# Patient Record
Sex: Female | Born: 1990
Health system: Southern US, Community
[De-identification: ages and names within clinical notes are randomized; demographics above are authoritative.]

## PROBLEM LIST (undated history)

## (undated) DIAGNOSIS — A64 Unspecified sexually transmitted disease: Secondary | ICD-10-CM

## (undated) HISTORY — PX: SALPINGOSTOMY: SHX2372

## (undated) HISTORY — PX: TONSILLECTOMY: SUR1361

---

## 2013-04-04 ENCOUNTER — Encounter (HOSPITAL_COMMUNITY): Payer: Self-pay | Admitting: Emergency Medicine

## 2013-04-04 ENCOUNTER — Emergency Department (HOSPITAL_COMMUNITY)
Admission: EM | Admit: 2013-04-04 | Discharge: 2013-04-04 | Disposition: A | Discharge status: Home / Self Care | Payer: 59 | Attending: Emergency Medicine | Admitting: Emergency Medicine

## 2013-04-04 DIAGNOSIS — R197 Diarrhea, unspecified: Secondary | ICD-10-CM | POA: Insufficient documentation

## 2013-04-04 DIAGNOSIS — R1110 Vomiting, unspecified: Secondary | ICD-10-CM

## 2013-04-04 DIAGNOSIS — Z3202 Encounter for pregnancy test, result negative: Secondary | ICD-10-CM | POA: Insufficient documentation

## 2013-04-04 DIAGNOSIS — R109 Unspecified abdominal pain: Secondary | ICD-10-CM | POA: Insufficient documentation

## 2013-04-04 DIAGNOSIS — F17200 Nicotine dependence, unspecified, uncomplicated: Secondary | ICD-10-CM | POA: Insufficient documentation

## 2013-04-04 DIAGNOSIS — R51 Headache: Secondary | ICD-10-CM | POA: Insufficient documentation

## 2013-04-04 DIAGNOSIS — M549 Dorsalgia, unspecified: Secondary | ICD-10-CM

## 2013-04-04 DIAGNOSIS — R112 Nausea with vomiting, unspecified: Secondary | ICD-10-CM | POA: Insufficient documentation

## 2013-04-04 LAB — CBC WITH DIFFERENTIAL/PLATELET
BASOS PCT: 0 % (ref 0–1)
Basophils Absolute: 0 10*3/uL (ref 0.0–0.1)
Eosinophils Absolute: 0.1 10*3/uL (ref 0.0–0.7)
Eosinophils Relative: 1 % (ref 0–5)
HCT: 34.6 % — ABNORMAL LOW (ref 36.0–46.0)
Hemoglobin: 11.6 g/dL — ABNORMAL LOW (ref 12.0–15.0)
Lymphocytes Relative: 32 % (ref 12–46)
Lymphs Abs: 2.3 10*3/uL (ref 0.7–4.0)
MCH: 27.4 pg (ref 26.0–34.0)
MCHC: 33.5 g/dL (ref 30.0–36.0)
MCV: 81.6 fL (ref 78.0–100.0)
Monocytes Absolute: 0.5 10*3/uL (ref 0.1–1.0)
Monocytes Relative: 7 % (ref 3–12)
NEUTROS PCT: 60 % (ref 43–77)
Neutro Abs: 4.4 10*3/uL (ref 1.7–7.7)
Platelets: 277 10*3/uL (ref 150–400)
RBC: 4.24 MIL/uL (ref 3.87–5.11)
RDW: 14.1 % (ref 11.5–15.5)
WBC: 7.3 10*3/uL (ref 4.0–10.5)

## 2013-04-04 LAB — COMPREHENSIVE METABOLIC PANEL
ALBUMIN: 3.8 g/dL (ref 3.5–5.2)
ALK PHOS: 51 U/L (ref 39–117)
ALT: 26 U/L (ref 0–35)
AST: 24 U/L (ref 0–37)
BUN: 10 mg/dL (ref 6–23)
CO2: 25 mEq/L (ref 19–32)
Calcium: 9.3 mg/dL (ref 8.4–10.5)
Chloride: 100 mEq/L (ref 96–112)
Creatinine, Ser: 0.72 mg/dL (ref 0.50–1.10)
GFR calc Af Amer: >90 mL/min (ref >90)
GFR calc non Af Amer: >90 mL/min (ref >90)
Glucose, Bld: 83 mg/dL (ref 70–99)
POTASSIUM: 3.7 meq/L (ref 3.7–5.3)
SODIUM: 137 meq/L (ref 137–147)
TOTAL PROTEIN: 7.8 g/dL (ref 6.0–8.3)
Total Bilirubin: <0.2 mg/dL — ABNORMAL LOW (ref 0.3–1.2)

## 2013-04-04 LAB — URINALYSIS, ROUTINE W REFLEX MICROSCOPIC
Bilirubin Urine: NEGATIVE
Glucose, UA: NEGATIVE mg/dL
Hgb urine dipstick: NEGATIVE
Ketones, ur: NEGATIVE mg/dL
LEUKOCYTES UA: NEGATIVE
NITRITE: NEGATIVE
PH: 6.5 (ref 5.0–8.0)
Protein, ur: NEGATIVE mg/dL
SPECIFIC GRAVITY, URINE: 1.005 (ref 1.005–1.030)
Urobilinogen, UA: 1 mg/dL (ref 0.0–1.0)

## 2013-04-04 LAB — POCT PREGNANCY, URINE: Preg Test, Ur: NEGATIVE

## 2013-04-04 LAB — HCG, SERUM, QUALITATIVE: PREG SERUM: NEGATIVE

## 2013-04-04 MED ORDER — TRAMADOL HCL 50 MG PO TABS: 50.0000 mg | ORAL_TABLET | Freq: Four times a day (QID) | ORAL | Status: DC | PRN

## 2013-04-04 MED ORDER — ONDANSETRON 4 MG PO TBDP
4.0000 mg | ORAL_TABLET | Freq: Three times a day (TID) | ORAL | Status: DC | PRN
Start: 2013-04-04 — End: 2016-12-05

## 2013-04-04 NOTE — ED Provider Notes (Signed)
CSN: [id number]     Arrival date & time 04/04/13  1959 History   First MD Initiated Contact with Patient 04/04/13 2001     Chief Complaint  Patient presents with  . Abdominal Pain  . Hematemesis   (Consider location/radiation/quality/duration/timing/severity/associated sxs/prior Treatment) HPI Comments: Patient presents with complaint of abdominal pain, back pain, and vomiting for the past 1 to 2 months. Patient was concerned today because she noted a small amount of blood in her vomit. She vomited 4 times today. Patient states that she has been vomiting nearly every day. Patient states that the symptoms are similar to previous pregnancy. She does note, that she has had negative urine pregnancy test in the past when her blood has shown positive pregnancy. Patient denies fever, chest pain, shortness of breath, urinary symptoms, current vaginal bleeding. Patient's last menstrual period ended early December 2014. Patient notes previous surgery for, 'tube repair' after the birth of her son. No treatments prior to arrival. The onset of this condition was acute. The course is constant. Aggravating factors: none. Alleviating factors: none.    Patient is a 23 y.o. female presenting with abdominal pain. The history is provided by the patient.  Abdominal Pain Associated symptoms: diarrhea, nausea and vomiting   Associated symptoms: no chest pain, no cough, no dysuria, no fever, no sore throat and no vaginal bleeding     History reviewed. No pertinent past medical history. History reviewed. No pertinent past surgical history. History reviewed. No pertinent family history. History  Substance Use Topics  . Smoking status: Current Some Day Smoker  . Smokeless tobacco: Not on file  . Alcohol Use: Yes   OB History   Grav Para Term Preterm Abortions TAB SAB Ect Mult Living                 Review of Systems  Constitutional: Negative for fever.  HENT: Negative for rhinorrhea and sore throat.   Eyes:  Negative for redness.  Respiratory: Negative for cough.   Cardiovascular: Negative for chest pain.  Gastrointestinal: Positive for nausea, vomiting, abdominal pain and diarrhea.  Genitourinary: Negative for dysuria and vaginal bleeding.  Musculoskeletal: Negative for myalgias.  Skin: Negative for rash.  Neurological: Positive for headaches.    Allergies  Review of patient's allergies indicates no known allergies.  Home Medications   Current Outpatient Rx  Name  Route  Sig  Dispense  Refill  . ondansetron (ZOFRAN ODT) 4 MG disintegrating tablet   Oral   Take 1 tablet (4 mg total) by mouth every 8 (eight) hours as needed for nausea or vomiting.   10 tablet   0   . traMADol (ULTRAM) 50 MG tablet   Oral   Take 1 tablet (50 mg total) by mouth every 6 (six) hours as needed.   10 tablet   0    BP 124/73  Pulse 81  Temp(Src) 98.3 F (36.8 C) (Oral)  Resp 14  SpO2 100%  LMP 02/25/2013  Physical Exam  Nursing note and vitals reviewed. Constitutional: She appears well-developed and well-nourished.  HENT:  Head: Normocephalic and atraumatic.  Eyes: Conjunctivae are normal. Right eye exhibits no discharge. Left eye exhibits no discharge.  Neck: Normal range of motion. Neck supple.  Cardiovascular: Normal rate, regular rhythm and normal heart sounds.   Pulmonary/Chest: Effort normal and breath sounds normal.  Abdominal: Soft. Bowel sounds are normal. There is no tenderness. There is no rebound and no guarding.  Neurological: She is alert.  Skin: Skin  is warm and dry.  Psychiatric: She has a normal mood and affect.    ED Course  Procedures (including critical care time) Labs Review Labs Reviewed  CBC WITH DIFFERENTIAL - Abnormal; Notable for the following:    Hemoglobin 11.6 (*)    HCT 34.6 (*)    All other components within normal limits  COMPREHENSIVE METABOLIC PANEL - Abnormal; Notable for the following:    Total Bilirubin <0.2 (*)    All other components within  normal limits  URINALYSIS, ROUTINE W REFLEX MICROSCOPIC - Abnormal; Notable for the following:    APPearance CLOUDY (*)    All other components within normal limits  HCG, SERUM, QUALITATIVE  POCT PREGNANCY, URINE   Imaging Review No results found.  EKG Interpretation   None      9:14 PM Patient seen and examined. Work-up initiated. Medications ordered.   Vital signs reviewed and are as follows: Filed Vitals:   04/04/13 2005  BP: 124/73  Pulse: 81  Temp: 98.3 F (36.8 C)  Resp: 14   Labs reassuring. Pt informed. She is in room eating and drinking. Abd remains soft and non-tender. Urged PCP f/u for chronic abd pain, back pain, intermittent diarrhea. Will give pain and nausea medication to treat symptomatically.   The patient was urged to return to the Emergency Department immediately with worsening of current symptoms, worsening abdominal pain, persistent vomiting, blood noted in stools, fever, or any other concerns. The patient verbalized understanding.   Patient counseled on use of narcotic pain medications. Counseled not to combine these medications with others containing tylenol. Urged not to drink alcohol, drive, or perform any other activities that requires focus while taking these medications. The patient verbalizes understanding and agrees with the plan.    MDM   1. Vomiting   2. Back pain    Patient with abd pain, back pain, intermittent vomiting/diarrhea for 1-2 months. Her labs tonight are reassuring. No pregnancy. Symptoms controlled in ED. Abd is soft and non-tender. No imaging indicated at this time. No acute or life-threatening conditions suspected and no indications for admission found. Symptoms are chronic in nature. Will have patient f/u as outpatient, return if worsening. Referrals given.     Renne CriglerJoshua Shatonia Hoots, PA-C 04/05/13 0020

## 2013-04-04 NOTE — ED Notes (Signed)
Pt c/o abdominal pain, N/V, lower back pain x 1 month. Pt also c/o blood in her vomit today. Pt sts she hasn't had her period since the beginning of November. Pt sts "I could be pregnant." A&Ox4. NAD noted.

## 2013-04-04 NOTE — Progress Notes (Signed)
   CARE MANAGEMENT ED NOTE 04/04/2013  Patient:  Janet Walton,Janet Walton   Account Number:  [id number]  Date Initiated:  04/04/2013  Documentation initiated by:  Radford PaxFERRERO,Alyene Predmore  Subjective/Objective Assessment:   Patient presents to Ed with abodminal pain, n/v and lower back pain for a month.     Subjective/Objective Assessment Detail:   Patient without pmhx.     Action/Plan:   Pregnancy test completed in the ED   Action/Plan Detail:   Anticipated DC Date:  04/04/2013     Status Recommendation to Physician:   Result of Recommendation:    Other ED Services  Consult Working Plan    DC Planning Services  Other  PCP issues    Choice offered to / List presented to:            Status of service:  Completed, signed off  ED Comments:   ED Comments Detail:  Patient confirms she has [employer] insurance. Patient does not have a pcp.  EDCM instructed patient to go to insurance company website to help her find a physician who is close to her and within network.  Patient verbalized understanding.  EDCM stressed importance of finding a pcp.

## 2013-04-04 NOTE — Discharge Instructions (Signed)
Please read and follow all provided instructions.  Your diagnoses today include:  1. Vomiting   2. Back pain     Tests performed today include:  Blood counts and electrolytes  Blood tests to check liver and kidney function  Urine test to look for infection and pregnancy (in women)  Blood test for pregnancy - negative  Vital signs. See below for your results today.   Medications prescribed:   Tramadol - narcotic-like pain medication  DO NOT drive or perform any activities that require you to be awake and alert because this medicine can make you drowsy.    Zofran (ondansetron) - for nausea and vomiting  Take any prescribed medications only as directed.  Home care instructions:   Follow any educational materials contained in this packet.  Follow-up instructions: Please follow-up with your primary care provider in the next 3 days for further evaluation of your symptoms. If you do not have a primary care doctor -- see below for referral information.   Return instructions:  SEEK IMMEDIATE MEDICAL ATTENTION IF:  The pain does not go away or becomes severe   A temperature above 101F develops   Repeated vomiting occurs (multiple episodes)   The pain becomes localized to portions of the abdomen. The right side could possibly be appendicitis. In an adult, the left lower portion of the abdomen could be colitis or diverticulitis.   Blood is being passed in stools or vomit (bright red or black tarry stools)   You develop chest pain, difficulty breathing, dizziness or fainting, or become confused, poorly responsive, or inconsolable (young children)  If you have any other emergent concerns regarding your health  Additional Information: Abdominal (belly) pain can be caused by many things. Your caregiver performed an examination and possibly ordered blood/urine tests and imaging (CT scan, x-rays, ultrasound). Many cases can be observed and treated at home after initial evaluation  in the emergency department. Even though you are being discharged home, abdominal pain can be unpredictable. Therefore, you need a repeated exam if your pain does not resolve, returns, or worsens. Most patients with abdominal pain don't have to be admitted to the hospital or have surgery, but serious problems like appendicitis and gallbladder attacks can start out as nonspecific pain. Many abdominal conditions cannot be diagnosed in one visit, so follow-up evaluations are very important.  Your vital signs today were: BP 124/73   Pulse 81   Temp(Src) 98.3 F (36.8 C) (Oral)   Resp 14   SpO2 100%   LMP 02/25/2013 If your blood pressure (bp) was elevated above 135/85 this visit, please have this repeated by your doctor within one month. --------------  Emergency Department Resource Guide 1) Find a Doctor and Pay Out of Pocket Although you won't have to find out who is covered by your insurance plan, it is a good idea to ask around and get recommendations. You will then need to call the office and see if the doctor you have chosen will accept you as a new patient and what types of options they offer for patients who are self-pay. Some doctors offer discounts or will set up payment plans for their patients who do not have insurance, but you will need to ask so you aren't surprised when you get to your appointment.  2) Contact Your Local Health Department Not all health departments have doctors that can see patients for sick visits, but many do, so it is worth a call to see if yours does. If you  don't know where your local health department is, you can check in your phone book. The CDC also has a tool to help you locate your state's health department, and many state websites also have listings of all of their local health departments.  3) Find a Walk-in Clinic If your illness is not likely to be very severe or complicated, you may want to try a walk in clinic. These are popping up all over the country in  pharmacies, drugstores, and shopping centers. They're usually staffed by nurse practitioners or physician assistants that have been trained to treat common illnesses and complaints. They're usually fairly quick and inexpensive. However, if you have serious medical issues or chronic medical problems, these are probably not your best option.  No Primary Care Doctor: - Call Health Connect at  [phone] - they can help you locate a primary care doctor that  accepts your insurance, provides certain services, etc. - Physician Referral Service- [phone]  Chronic Pain Problems: Organization         Address  Phone   Notes  [facility]  [phone] Patients need to be referred by their primary care doctor.   Medication Assistance: Organization         Address  Phone   Notes  [facility] Medication [facility] [address] [place], [place] [address] [phone] --Must be a resident of [facility] -- Must have NO insurance coverage whatsoever (no Medicaid/ Medicare, etc.) -- The pt. MUST have a primary care doctor that directs their care regularly and follows them in the community   [facility]  [phone]   [employer]  [phone]    Agencies that provide inexpensive medical care: Organization         Address  Phone   Notes  [facility]  [phone]   [facility]    [phone]   [facility] [address] [place], [place] [address] [phone]   [facility] [address], [place] [phone]   Planned Parenthood    [phone]   [facility]    [phone]   [facility]  [address], [place] Phone:  [phone], Fax:  [phone] Hours of Operation:  9 am - 6 pm, M-F.  Also accepts Medicaid/Medicare and self-pay.  [facility]  [address],  [place] Phone: [phone], Fax: [phone]. Hours of Operation:  8:30 am - 5:30 pm, M-F.  Also accepts Medicaid and self-pay.  [facility] [place] [address], [place] Phone: [phone]   [facility] [address] Natasha Bence [place], [place] [phone], Ext. [phone] Mondays & Thursdays: 7-9 AM.  First 15 patients are seen on a first come, first serve basis.    Medicaid-accepting [facility] Providers:  Organization         Address  Phone   Notes  [facility] [address], [place] [phone] Also accepts self-pay patients.  [facility] [address] Laurell Josephs [place], [place]  [phone]   [facility] [address], [place] [phone]   [facility] [address], [place] [phone]   Renaye Rakers [address], [place]   [phone] Only accepts [place]  Access Medicaid patients after they have their name applied to their card.   Self-Pay (no insurance) in [facility]:  Organization         Address  Phone   Notes  Sickle Cell Patients, [facility] [address], [place] [phone]) [phone]   [facility] [address], [place] [phone]) [phone]   [facility]  [address], [place] [phone]) [phone]   [facility]/Dr. Osei-Bonsu  [address] [place] [address], [place] or [phone], [place] [phone]) [phone] Phone number for both [place] and [place] locations is the same.  [facility] [address], [place] [phone]) [phone]   [facility] [address] [place] [address], [place] or [address] [phone]) [phone]   [facility] [address], [place] [phone](336) [phone], phone; [phone], fax Sees patients 1st and 3rd Saturday of every month.  Must not  qualify for public or private insurance (i.e. Medicaid, Medicare, [place] Health Choice, Veterans' Benefits)  Household income should be no more than 200% of the poverty level The clinic cannot treat you if you are pregnant or think you are pregnant  Sexually transmitted diseases are not treated at the clinic.    Dental Care: Organization         Address  Phone  Notes  [facility] [address], [place] [phone]) [phone] Accepts children up to age 23 who are enrolled in [place] or [place] Health Choice; pregnant women with a Medicaid card; and children who have applied for Medicaid or [place] Health Choice, but were declined, whose parents can pay a reduced fee at time of service.  [facility] [place]  [address], [place] [phone] Accepts children up to age 23 who are enrolled in [place] or [place] Health Choice; pregnant women with a Medicaid card; and children who have applied for Medicaid or [place] Health Choice, but were declined, whose parents can pay a reduced fee at time of service.  [facility]  [address], [place] [phone]) [phone] Patients are seen by appointment only. Walk-ins are not accepted. [facility] will see patients 23 years of age and older. Monday - Tuesday (8am-5pm) Most Wednesdays (8:30-5pm) $30 per visit, cash only  [facility]  [address], [facility] [phone]) [phone] Patients are seen by appointment only. Walk-ins are not accepted. [facility] will see patients 23 years of age and older. One Wednesday Evening (Monthly: Volunteer Based).  $30 per visit, cash only  [facility]  [phone]) [phone] for adults; Children under age 594, call Graduate Pediatric Dentistry at [phone]) [phone]. Children aged 764-14, please call [phone]) [phone] to request a pediatric application.  Dental services are provided  in all areas of dental care including fillings, crowns and bridges, complete and partial dentures, implants, gum treatment, root canals, and extractions. Preventive care is also provided. Treatment is provided to both adults and children. Patients are selected via a lottery and there is often a waiting list.   [facility] [address], [place]  [phone]) [phone] [email or web address]   [facility] [address], [place], [place] [phone], Ext. [phone] Second and Fourth Thursday of each month, opens at 6:30 AM; Clinic ends at 9  AM.  Patients are seen on a first-come first-served basis, and a limited number are seen during each clinic.   [facility]  [address] [place], [place] [phone]   Eligibility Requirements You must have lived in [place], [place], or [place] counties for at least the last three months.   You cannot be eligible for state or federal sponsored [place], including [employer], [place], or [employer].   You generally cannot be eligible for healthcare insurance through your employer.    How to apply: Eligibility screenings are held every Tuesday and Wednesday afternoon from 1:00 pm until 4:00 pm. You do not need an appointment for the interview!  [facility] [address], [place], [place] [phone]   [facility] Health Department  [phone]   [facility] Health Department  [phone]   [facility] Health Department  [phone]    Behavioral Health Resources in the Community: Intensive Outpatient Programs Organization         Address  Phone  Notes  [facility] [address] [place], [place] [phone]   [facility] [address], [place], [place] [phone]   [facility] [address] [place], [place]  [phone]   [facility] [address]  [place], [place]  [phone] or [phone]   Substance Abuse Resources Organization         Address  Phone  Notes  Alcohol [facility] Drug Services  [phone]   [facility]  [phone]   [facility] [place]  [phone]   Floydene Flock  [phone]   Residential & Outpatient Substance Abuse Program  [phone]   Psychological Services Organization         Address  Phone  Notes  [facility]  [phone]   [facility]  [phone]   [facility] [address], [place] [phone] or [phone]    Mobile Crisis Teams Organization         Address  Phone  Notes  [facility], Mobile Crisis Care Unit  [phone]   [facility]  [address] [place], [place] [phone]   Doristine Locks [address] [place] [phone]    Self-Help/Support Groups Organization         Address  Phone             Notes  Mental Health Assoc. of  - variety of support groups  336- I7437963 Call for more information  Narcotics Anonymous (NA), [facility] [address], [employer] [place]  2 meetings at this location   Statistician         Address  Phone  Notes  [facility] [address],    [place]  [phone]   [facility]  [address] [place] [address], [place], [place] [phone]   [facility] [address] [place], [place] [phone] Admissions: 8am-3pm M-F  [facility] [address],    [place], [place] [phone]   [facility] [address] Starling [place], [place] [phone]   [facility] [address],  [place], [place] [phone]   [facility] - Intensive Outpatient [address]ure[address], [place], [place] [phone]   [facility] ([facility]iction [facility] [address] [place]  [place], [place] [phone] or  [phone]   [facility] (  RTS) [address], [place], [place] [phone] Accepts Medicaid  [facility] [address]  [place] [phone] Substance Abuse/Addiction Treatment   [facility] Organization         Address  Phone  Notes  [facility]  [phone]   Angie Fava, PhD [address] Ervin Knack [place], [place]   [phone] or [phone]   [facility] Behavioral   [address] [place], [place] [phone]   [facility] [address], [place], [place] [phone] Insurance/Medicaid/sponsorship through [facility] and Families [address]                                    [place], [place] [phone] Therapy/tele-psych/case  [facility] [address], [place] [phone]    Dr. Lolly Mustache  [phone]   [facility]  [employer] [facility] Dept. 1) [address], [place] 2) [address], [place] 3)  [address], [place] [phone]  [phone]   [facility] Child Abuse Hotline 475-245-6438 or [phone] (After Hours)

## 2013-04-04 NOTE — ED Notes (Signed)
Pt arrived to the ED with a complaint of abdominal pain.  Pt states she has been having abdominal pain and back pain for a month.  Pt states she has had emesis for the same amount of time.  Today pt noted a small amount of blood in her emesis.

## 2013-04-05 NOTE — ED Provider Notes (Signed)
Medical screening examination/treatment/procedure(s) were performed by non-physician practitioner and as supervising physician I was immediately available for consultation/collaboration.  EKG Interpretation   None        Hurman HornJohn M Katrice Goel, MD 04/05/13 1232

## 2014-05-08 ENCOUNTER — Emergency Department (HOSPITAL_BASED_OUTPATIENT_CLINIC_OR_DEPARTMENT_OTHER)
Admission: EM | Admit: 2014-05-08 | Discharge: 2014-05-08 | Disposition: A | Discharge status: Home / Self Care | Payer: 59 | Attending: Emergency Medicine | Admitting: Emergency Medicine

## 2014-05-08 ENCOUNTER — Encounter (HOSPITAL_BASED_OUTPATIENT_CLINIC_OR_DEPARTMENT_OTHER): Payer: Self-pay | Admitting: *Deleted

## 2014-05-08 DIAGNOSIS — R531 Weakness: Secondary | ICD-10-CM

## 2014-05-08 DIAGNOSIS — M791 Myalgia: Secondary | ICD-10-CM | POA: Insufficient documentation

## 2014-05-08 DIAGNOSIS — Z3202 Encounter for pregnancy test, result negative: Secondary | ICD-10-CM | POA: Insufficient documentation

## 2014-05-08 DIAGNOSIS — R52 Pain, unspecified: Secondary | ICD-10-CM | POA: Diagnosis present

## 2014-05-08 DIAGNOSIS — Z720 Tobacco use: Secondary | ICD-10-CM | POA: Insufficient documentation

## 2014-05-08 LAB — COMPREHENSIVE METABOLIC PANEL
ALK PHOS: 39 U/L (ref 39–117)
ALT: 18 U/L (ref 0–35)
AST: 25 U/L (ref 0–37)
Albumin: 3.8 g/dL (ref 3.5–5.2)
Anion gap: 11 (ref 5–15)
BUN: 14 mg/dL (ref 6–23)
CHLORIDE: 106 mmol/L (ref 96–112)
CO2: 25 mmol/L (ref 19–32)
Calcium: 8.8 mg/dL (ref 8.4–10.5)
Creatinine, Ser: 0.74 mg/dL (ref 0.50–1.10)
GFR calc non Af Amer: >90 mL/min (ref >90)
GLUCOSE: 110 mg/dL — AB (ref 70–99)
Potassium: 3.5 mmol/L (ref 3.5–5.1)
SODIUM: 142 mmol/L (ref 135–145)
Total Bilirubin: 0.3 mg/dL (ref 0.3–1.2)
Total Protein: 7.4 g/dL (ref 6.0–8.3)

## 2014-05-08 LAB — CBC WITH DIFFERENTIAL/PLATELET
BASOS PCT: 0 % (ref 0–1)
Basophils Absolute: 0 10*3/uL (ref 0.0–0.1)
Eosinophils Absolute: 0.2 10*3/uL (ref 0.0–0.7)
Eosinophils Relative: 4 % (ref 0–5)
HCT: 36.4 % (ref 36.0–46.0)
Hemoglobin: 11.9 g/dL — ABNORMAL LOW (ref 12.0–15.0)
LYMPHS ABS: 2.4 10*3/uL (ref 0.7–4.0)
LYMPHS PCT: 40 % (ref 12–46)
MCH: 27.6 pg (ref 26.0–34.0)
MCHC: 32.7 g/dL (ref 30.0–36.0)
MCV: 84.5 fL (ref 78.0–100.0)
Monocytes Absolute: 0.5 10*3/uL (ref 0.1–1.0)
Monocytes Relative: 9 % (ref 3–12)
NEUTROS PCT: 47 % (ref 43–77)
Neutro Abs: 2.7 10*3/uL (ref 1.7–7.7)
PLATELETS: 239 10*3/uL (ref 150–400)
RBC: 4.31 MIL/uL (ref 3.87–5.11)
RDW: 13.9 % (ref 11.5–15.5)
WBC: 5.9 10*3/uL (ref 4.0–10.5)

## 2014-05-08 LAB — URINE MICROSCOPIC-ADD ON

## 2014-05-08 LAB — URINALYSIS, ROUTINE W REFLEX MICROSCOPIC
BILIRUBIN URINE: NEGATIVE
GLUCOSE, UA: NEGATIVE mg/dL
HGB URINE DIPSTICK: NEGATIVE
Ketones, ur: NEGATIVE mg/dL
Nitrite: NEGATIVE
Protein, ur: NEGATIVE mg/dL
SPECIFIC GRAVITY, URINE: 1.024 (ref 1.005–1.030)
Urobilinogen, UA: 1 mg/dL (ref 0.0–1.0)
pH: 6.5 (ref 5.0–8.0)

## 2014-05-08 LAB — MONONUCLEOSIS SCREEN: Mono Screen: NEGATIVE

## 2014-05-08 LAB — HCG, SERUM, QUALITATIVE: PREG SERUM: NEGATIVE

## 2014-05-08 LAB — PREGNANCY, URINE: PREG TEST UR: NEGATIVE

## 2014-05-08 MED ORDER — CYCLOBENZAPRINE HCL 10 MG PO TABS: 10.0000 mg | ORAL_TABLET | Freq: Two times a day (BID) | ORAL | Status: DC | PRN

## 2014-05-08 MED ORDER — KETOROLAC TROMETHAMINE 60 MG/2ML IM SOLN
30.0000 mg | Freq: Once | INTRAMUSCULAR | Status: AC
  Administered 2014-05-08: 30 mg via INTRAMUSCULAR
  Filled 2014-05-08: qty 2

## 2014-05-08 MED ORDER — DIAZEPAM 5 MG PO TABS
5.0000 mg | ORAL_TABLET | Freq: Once | ORAL | Status: AC
  Administered 2014-05-08: 5 mg via ORAL
  Filled 2014-05-08: qty 1

## 2014-05-08 NOTE — ED Notes (Signed)
Pt reports she was seen at [facility] last night has a urinalysis and bloodwork that "showed a virus".  She has been taking Ibuprofen, Vicoden and Aleve without relief.  Denies rash, fever, vomiting diarrhea, recent illness or sick exposure.

## 2014-05-08 NOTE — ED Notes (Signed)
Pt c/o generalized body aches x3 days. [facility] and had blood work done. She sts she was dx with "something viral."

## 2014-05-08 NOTE — Discharge Instructions (Signed)
Drink plenty of fluids. Avoid stressors. Continue ibuprofen for pain. Flexeril for muscle stiffness/spasms. Follow up with primary care doctor for recheck in 2 days, you can also follow up at an urgent care.   Muscle Pain Muscle pain (myalgia) may be caused by many things, including:  Overuse or muscle strain, especially if you are not in shape. This is the most common cause of muscle pain.  Injury.  Bruises.  Viruses, such as the flu.  Infectious diseases.  Fibromyalgia, which is a chronic condition that causes muscle tenderness, fatigue, and headache.  Autoimmune diseases, including lupus.  Certain drugs, including ACE inhibitors and statins. Muscle pain may be mild or severe. In most cases, the pain lasts only a short time and goes away without treatment. To diagnose the cause of your muscle pain, your health care provider will take your medical history. This means he or she will ask you when your muscle pain began and what has been happening. If you have not had muscle pain for very long, your health care provider may want to wait before doing much testing. If your muscle pain has lasted a long time, your health care provider may want to run tests right away. If your health care provider thinks your muscle pain may be caused by illness, you may need to have additional tests to rule out certain conditions.  Treatment for muscle pain depends on the cause. Home care is often enough to relieve muscle pain. Your health care provider may also prescribe anti-inflammatory medicine. HOME CARE INSTRUCTIONS Watch your condition for any changes. The following actions may help to lessen any discomfort you are feeling:  Only take over-the-counter or prescription medicines as directed by your health care provider.  Apply ice to the sore muscle:  Put ice in a plastic bag.  Place a towel between your skin and the bag.  Leave the ice on for 15-20 minutes, 3-4 times a day.  You may alternate  applying hot and cold packs to the muscle as directed by your health care provider.  If overuse is causing your muscle pain, slow down your activities until the pain goes away.  Remember that it is normal to feel some muscle pain after starting a workout program. Muscles that have not been used often will be sore at first.  Do regular, gentle exercises if you are not usually active.  Warm up before exercising to lower your risk of muscle pain.  Do not continue working out if the pain is very bad. Bad pain could mean you have injured a muscle. SEEK MEDICAL CARE IF:  Your muscle pain gets worse, and medicines do not help.  You have muscle pain that lasts longer than 3 days.  You have a rash or fever along with muscle pain.  You have muscle pain after a tick bite.  You have muscle pain while working out, even though you are in good physical condition.  You have redness, soreness, or swelling along with muscle pain.  You have muscle pain after starting a new medicine or changing the dose of a medicine. SEEK IMMEDIATE MEDICAL CARE IF:  You have trouble breathing.  You have trouble swallowing.  You have muscle pain along with a stiff neck, fever, and vomiting.  You have severe muscle weakness or cannot move part of your body. MAKE SURE YOU:   Understand these instructions.  Will watch your condition.  Will get help right away if you are not doing well or get worse.  Document Released: 02/02/2006 Document Revised: 03/18/2013 Document Reviewed: 01/07/2013 [facility] Patient Information 2015 [place], [place]. This information is not intended to replace advice given to you by your health care provider. Make sure you discuss any questions you have with your health care provider.

## 2014-05-08 NOTE — ED Provider Notes (Signed)
CSN: [id number]     Arrival date & time 05/08/14  1821 History   First MD Initiated Contact with Patient 05/08/14 1827     Chief Complaint  Patient presents with  . Generalized Body Aches     (Consider location/radiation/quality/duration/timing/severity/associated sxs/prior Treatment) HPI Janet Walton is a 24 y.o. female with no known medical problems presents to emergency department complaining of generalized body aches, fatigue. Pt states symptoms began 3 days ago. States has had some nasal congestion, sore throat, dry cough. States pain in arms, neck, legs, back, chest. Worse with movement and coughing. Pt reports hx of the same. States she has tried ibuprofen, aleve, advil, tylenol, tramadol, but states "I am immune to all that because that's all [facility] gives me." Pt in fact went to [facility] hospital yesterday and was told she may have a viral infection. Pt states she continues to have pain "all over" so she came here.   History reviewed. No pertinent past medical history. History reviewed. No pertinent past surgical history. No family history on file. History  Substance Use Topics  . Smoking status: Current Some Day Smoker  . Smokeless tobacco: Not on file  . Alcohol Use: Yes   OB History    No data available     Review of Systems  Constitutional: Negative for fever and chills.  Respiratory: Negative for cough, chest tightness and shortness of breath.   Cardiovascular: Negative for chest pain, palpitations and leg swelling.  Gastrointestinal: Negative for nausea, vomiting, abdominal pain and diarrhea.  Genitourinary: Negative for dysuria, flank pain and pelvic pain.  Musculoskeletal: Positive for myalgias, back pain and arthralgias. Negative for neck pain and neck stiffness.  Skin: Negative for rash.  Neurological: Negative for dizziness, weakness and headaches.  All other systems reviewed and are negative.     Allergies  Review of patient's allergies indicates no known  allergies.  Home Medications   Prior to Admission medications   Medication Sig Start Date End Date Taking? Authorizing Provider  ondansetron (ZOFRAN ODT) 4 MG disintegrating tablet Take 1 tablet (4 mg total) by mouth every 8 (eight) hours as needed for nausea or vomiting. 04/04/13   Renne Crigler, PA-C  traMADol (ULTRAM) 50 MG tablet Take 1 tablet (50 mg total) by mouth every 6 (six) hours as needed. 04/04/13   Renne Crigler, PA-C   BP 111/56 mmHg  Pulse 86  Temp(Src) 98.4 F (36.9 C) (Oral)  Resp 16  Ht  (1.676 m)  Wt 118 lb (53.524 kg)  BMI 19.05 kg/m2  SpO2 98%  LMP 05/04/2014 Physical Exam  Constitutional: She is oriented to person, place, and time. She appears well-developed and well-nourished. No distress.  HENT:  Head: Normocephalic.  Right Ear: External ear normal.  Left Ear: External ear normal.  Nose: Nose normal.  Mouth/Throat: Oropharynx is clear and moist.  TMs normal bilaterally  Eyes: Conjunctivae are normal.  Neck:  Full rom of the neck, pain with movement in any directions  Cardiovascular: Normal rate, regular rhythm and normal heart sounds.   Pulmonary/Chest: Effort normal and breath sounds normal. No respiratory distress. She has no wheezes. She has no rales.  Abdominal: Soft. Bowel sounds are normal. She exhibits no distension. There is no tenderness. There is no rebound.  Musculoskeletal: She exhibits no edema.  Full rom of all extremities, pain with movement of arms and legs  Neurological: She is alert and oriented to person, place, and time.  Skin: Skin is warm and dry.  Psychiatric:  She has a normal mood and affect. Her behavior is normal.  Nursing note and vitals reviewed.   ED Course  Procedures (including critical care time) Labs Review Labs Reviewed  URINALYSIS, ROUTINE W REFLEX MICROSCOPIC - Abnormal; Notable for the following:    APPearance CLOUDY (*)    Leukocytes, UA SMALL (*)    All other components within normal limits  CBC WITH  DIFFERENTIAL/PLATELET - Abnormal; Notable for the following:    Hemoglobin 11.9 (*)    All other components within normal limits  COMPREHENSIVE METABOLIC PANEL - Abnormal; Notable for the following:    Glucose, Bld 110 (*)    All other components within normal limits  URINE MICROSCOPIC-ADD ON - Abnormal; Notable for the following:    Squamous Epithelial / LPF MANY (*)    Bacteria, UA FEW (*)    Casts HYALINE CASTS (*)    All other components within normal limits  PREGNANCY, URINE  HCG, SERUM, QUALITATIVE  MONONUCLEOSIS SCREEN    Imaging Review No results found.   EKG Interpretation None      MDM   Final diagnoses:  Body aches  Weakness    patient with generalized body aches, weakness. Exam is unremarkable. Will get blood tests, urinalysis, urine pregnancy. Patient states that she has had negative urine pregnancy test before but positive blood test. Will add hCG. I do not think patient has meningitis, no evidence of any major infection. Will check Monospot.  7:53 PM Labs are all normal. Vital signs are all normal. We'll treat with Flexeril, NSAIDs, follow with primary care doctor. I explained to the patient that she will need further testing if her symptoms continue, including thyroid panel, vitamin levels, autoimmune workup. Patient voiced understanding, will follow up next week.  Filed Vitals:   05/08/14 1826  BP: 111/56  Pulse: 86  Temp: 98.4 F (36.9 C)  TempSrc: Oral  Resp: 16  Height: 5\' 6"  (1.676 m)  Weight: 118 lb (53.524 kg)  SpO2: 98%     Lottie Musselatyana A Nikayla Madaris, PA-C 05/08/14 1953  Gwyneth SproutWhitney Plunkett, MD 05/08/14 2255

## 2014-08-11 ENCOUNTER — Encounter (HOSPITAL_BASED_OUTPATIENT_CLINIC_OR_DEPARTMENT_OTHER): Payer: Self-pay | Admitting: *Deleted

## 2014-08-11 ENCOUNTER — Emergency Department (HOSPITAL_BASED_OUTPATIENT_CLINIC_OR_DEPARTMENT_OTHER)
Admission: EM | Admit: 2014-08-11 | Discharge: 2014-08-11 | Disposition: A | Discharge status: Home / Self Care | Payer: 59 | Attending: Emergency Medicine | Admitting: Emergency Medicine

## 2014-08-11 DIAGNOSIS — O99331 Smoking (tobacco) complicating pregnancy, first trimester: Secondary | ICD-10-CM | POA: Diagnosis not present

## 2014-08-11 DIAGNOSIS — F17209 Nicotine dependence, unspecified, with unspecified nicotine-induced disorders: Secondary | ICD-10-CM | POA: Diagnosis not present

## 2014-08-11 DIAGNOSIS — R110 Nausea: Secondary | ICD-10-CM

## 2014-08-11 DIAGNOSIS — Z3A01 Less than 8 weeks gestation of pregnancy: Secondary | ICD-10-CM | POA: Diagnosis not present

## 2014-08-11 DIAGNOSIS — O9989 Other specified diseases and conditions complicating pregnancy, childbirth and the puerperium: Secondary | ICD-10-CM | POA: Diagnosis present

## 2014-08-11 DIAGNOSIS — Z3491 Encounter for supervision of normal pregnancy, unspecified, first trimester: Secondary | ICD-10-CM

## 2014-08-11 DIAGNOSIS — R197 Diarrhea, unspecified: Secondary | ICD-10-CM | POA: Diagnosis not present

## 2014-08-11 LAB — URINALYSIS, ROUTINE W REFLEX MICROSCOPIC
Bilirubin Urine: NEGATIVE
GLUCOSE, UA: NEGATIVE mg/dL
Hgb urine dipstick: NEGATIVE
Ketones, ur: NEGATIVE mg/dL
Nitrite: NEGATIVE
PH: 7.5 (ref 5.0–8.0)
Protein, ur: NEGATIVE mg/dL
SPECIFIC GRAVITY, URINE: 1.014 (ref 1.005–1.030)
Urobilinogen, UA: 1 mg/dL (ref 0.0–1.0)

## 2014-08-11 LAB — URINE MICROSCOPIC-ADD ON

## 2014-08-11 LAB — PREGNANCY, URINE: PREG TEST UR: POSITIVE — AB

## 2014-08-11 MED ORDER — AMOXICILLIN 500 MG PO CAPS: 500.0000 mg | ORAL_CAPSULE | Freq: Three times a day (TID) | ORAL | Status: DC

## 2014-08-11 NOTE — ED Provider Notes (Signed)
CSN: [id number]     Arrival date & time 08/11/14  1614 History   First MD Initiated Contact with Patient 08/11/14 1638     Chief Complaint  Patient presents with  . Nausea     (Consider location/radiation/quality/duration/timing/severity/associated sxs/prior Treatment) HPI  24 year old female presents with nausea for last 3-4 days. Patient states she has been consistently nauseated but only worsens with any type of eating or drinking or she stands up too quickly. Has been able to keep down fluids and food but is uncomfortable. No vomiting. No fevers. No abdominal pain or cramping. No vaginal bleeding. When asked what diarrhea she states she's had it for "a while" but it is intermittent and today she has not had any. Patient states her last menstrual cycle was 06/30/14 and was normal. Denies any urinary symptoms or vaginal complaints.  History reviewed. No pertinent past medical history. History reviewed. No pertinent past surgical history. History reviewed. No pertinent family history. History  Substance Use Topics  . Smoking status: Current Some Day Smoker  . Smokeless tobacco: Not on file  . Alcohol Use: Yes   OB History    No data available     Review of Systems  Constitutional: Negative for fever.  Gastrointestinal: Positive for nausea and diarrhea. Negative for vomiting and abdominal pain.  Genitourinary: Negative for dysuria, vaginal bleeding, vaginal discharge, vaginal pain and menstrual problem.  Musculoskeletal: Negative for back pain.  All other systems reviewed and are negative.     Allergies  Review of patient's allergies indicates no known allergies.  Home Medications   Prior to Admission medications   Medication Sig Start Date End Date Taking? Authorizing Provider  amoxicillin (AMOXIL) 500 MG capsule Take 1 capsule (500 mg total) by mouth 3 (three) times daily. X 3 days 08/11/14   Pricilla LovelessScott Abrianna Sidman, MD  cyclobenzaprine (FLEXERIL) 10 MG tablet Take 1 tablet (10 mg  total) by mouth 2 (two) times daily as needed for muscle spasms. 05/08/14   Tatyana Kirichenko, PA-C  ondansetron (ZOFRAN ODT) 4 MG disintegrating tablet Take 1 tablet (4 mg total) by mouth every 8 (eight) hours as needed for nausea or vomiting. 04/04/13   Renne CriglerJoshua Geiple, PA-C  traMADol (ULTRAM) 50 MG tablet Take 1 tablet (50 mg total) by mouth every 6 (six) hours as needed. 04/04/13   Renne CriglerJoshua Geiple, PA-C   BP 98/60 mmHg  Pulse 87  Temp(Src) 98.4 F (36.9 C) (Oral)  Resp 16  Wt 105 lb (47.628 kg)  SpO2 100%  LMP 06/26/2014 Physical Exam  Constitutional: She is oriented to person, place, and time. She appears well-developed and well-nourished.  HENT:  Head: Normocephalic and atraumatic.  Right Ear: External ear normal.  Left Ear: External ear normal.  Nose: Nose normal.  Eyes: Right eye exhibits no discharge. Left eye exhibits no discharge.  Cardiovascular: Normal rate, regular rhythm and normal heart sounds.   Pulmonary/Chest: Effort normal and breath sounds normal.  Abdominal: Soft. She exhibits no distension. There is no tenderness. There is no CVA tenderness.  Neurological: She is alert and oriented to person, place, and time.  Skin: Skin is warm and dry.  Nursing note and vitals reviewed.   ED Course  Procedures (including critical care time) Labs Review Labs Reviewed  URINALYSIS, ROUTINE W REFLEX MICROSCOPIC - Abnormal; Notable for the following:    APPearance CLOUDY (*)    Leukocytes, UA MODERATE (*)    All other components within normal limits  PREGNANCY, URINE - Abnormal; Notable for the  following:    Preg Test, Ur POSITIVE (*)    All other components within normal limits  URINE MICROSCOPIC-ADD ON - Abnormal; Notable for the following:    Squamous Epithelial / LPF FEW (*)    Bacteria, UA FEW (*)    All other components within normal limits  URINE CULTURE    Imaging Review No results found.   EKG Interpretation None      MDM   Final diagnoses:  First  trimester pregnancy  Nausea    Patient's nausea is likely from first trimester pregnancy. By pregnancy wheel, she is a proximally 6 weeks and 0 days based on LMP. Patient has no abdominal symptoms at this time and I do not feel emergent ultrasound is needed as I have low suspicion for ectopic. Patient has no urinary symptoms but urine does have moderate leukocytes, thus will treat as if asymptomatic bacteriuria with amoxicillin. Discussed symptoms that would warrant return for immediate evaluation as well as the need for an OB/GYN and to start on prenatal vitamins. We discussed medicines that are safe and not safe in pregnancy and given that she is nauseated but seems to be eating and drinking okay, will hold off on prescription nausea medicines and refer to OB.    Pricilla LovelessScott Ysmael Hires, MD 08/11/14 [phone]

## 2014-08-11 NOTE — ED Notes (Signed)
Pt c/o nausea x 3 days LMP 4/1 .

## 2014-08-12 LAB — URINE CULTURE: Colony Count: 40000

## 2014-11-13 ENCOUNTER — Encounter (HOSPITAL_BASED_OUTPATIENT_CLINIC_OR_DEPARTMENT_OTHER): Payer: Self-pay

## 2014-11-13 ENCOUNTER — Emergency Department (HOSPITAL_BASED_OUTPATIENT_CLINIC_OR_DEPARTMENT_OTHER)
Admission: EM | Admit: 2014-11-13 | Discharge: 2014-11-13 | Disposition: A | Discharge status: Home / Self Care | Payer: 59 | Attending: Emergency Medicine | Admitting: Emergency Medicine

## 2014-11-13 ENCOUNTER — Emergency Department (HOSPITAL_BASED_OUTPATIENT_CLINIC_OR_DEPARTMENT_OTHER): Payer: 59

## 2014-11-13 DIAGNOSIS — R110 Nausea: Secondary | ICD-10-CM | POA: Insufficient documentation

## 2014-11-13 DIAGNOSIS — O9989 Other specified diseases and conditions complicating pregnancy, childbirth and the puerperium: Secondary | ICD-10-CM | POA: Insufficient documentation

## 2014-11-13 DIAGNOSIS — Z87891 Personal history of nicotine dependence: Secondary | ICD-10-CM | POA: Insufficient documentation

## 2014-11-13 DIAGNOSIS — Z3A20 20 weeks gestation of pregnancy: Secondary | ICD-10-CM | POA: Insufficient documentation

## 2014-11-13 DIAGNOSIS — R1084 Generalized abdominal pain: Secondary | ICD-10-CM | POA: Diagnosis not present

## 2014-11-13 DIAGNOSIS — R109 Unspecified abdominal pain: Secondary | ICD-10-CM

## 2014-11-13 DIAGNOSIS — O26899 Other specified pregnancy related conditions, unspecified trimester: Secondary | ICD-10-CM

## 2014-11-13 LAB — URINE MICROSCOPIC-ADD ON

## 2014-11-13 LAB — URINALYSIS, ROUTINE W REFLEX MICROSCOPIC
BILIRUBIN URINE: NEGATIVE
Glucose, UA: NEGATIVE mg/dL
HGB URINE DIPSTICK: NEGATIVE
Ketones, ur: NEGATIVE mg/dL
Nitrite: NEGATIVE
PROTEIN: NEGATIVE mg/dL
SPECIFIC GRAVITY, URINE: 1.015 (ref 1.005–1.030)
UROBILINOGEN UA: 1 mg/dL (ref 0.0–1.0)
pH: 7 (ref 5.0–8.0)

## 2014-11-13 MED ORDER — SODIUM CHLORIDE 0.9 % IV BOLUS (SEPSIS)
1000.0000 mL | Freq: Once | INTRAVENOUS | Status: AC
  Administered 2014-11-13: 1000 mL via INTRAVENOUS

## 2014-11-13 NOTE — ED Notes (Signed)
Pt not in room, iv supplies placed at bedside.

## 2014-11-13 NOTE — Discharge Instructions (Signed)

## 2014-11-13 NOTE — ED Provider Notes (Signed)
CSN: [id number]   Arrival date & time 11/13/14 1754  History  This chart was scribed for Rolan Bucco, MD by Bethel Born, ED Scribe. This patient was seen in room MH03/MH03 and the patient's care was started at 6:31 PM.  Chief Complaint  Patient presents with  . Abdominal Pain    HPI The history is provided by the patient. No language interpreter was used.   Janet Walton is a 24 y.o. female who is 19 weeks and 6 days pregnant presenting to the Emergency Department with a chief complaint of severe and intermittent abdominal pain with onset 3 weeks ago. The pain, located at the sides and lower abdomen, worsened last night. The episodes occur every 1-2 hours and last 30 minutes at a time. Associated symptoms include nausea. Pt denies leakage of fluid, vaginal bleeding, vaginal discharge, dysuria, and difficulty urinating. She is having normal bowel movements. Notes positive fetal movement. Scheduled for an [place] on 11/18/14.  G2. History reviewed. No pertinent past medical history.  Past Surgical History  Procedure Laterality Date  . Tonsillectomy      No family history on file.  Social History  Substance Use Topics  . Smoking status: Former Games developer  . Smokeless tobacco: None  . Alcohol Use: No     Review of Systems  Constitutional: Negative for fever, chills, diaphoresis and fatigue.  HENT: Negative for congestion, rhinorrhea and sneezing.   Eyes: Negative.   Respiratory: Negative for cough, chest tightness and shortness of breath.   Cardiovascular: Negative for chest pain and leg swelling.  Gastrointestinal: Positive for nausea and abdominal pain. Negative for vomiting, diarrhea and blood in stool.  Genitourinary: Negative for frequency, hematuria, flank pain and difficulty urinating.  Musculoskeletal: Negative for back pain and arthralgias.  Skin: Negative for rash.  Neurological: Negative for dizziness, speech difficulty, weakness, numbness and headaches.     Home  Medications   Prior to Admission medications   Medication Sig Start Date End Date Taking? Authorizing Provider  amoxicillin (AMOXIL) 500 MG capsule Take 1 capsule (500 mg total) by mouth 3 (three) times daily. X 3 days 08/11/14   Pricilla Loveless, MD  cyclobenzaprine (FLEXERIL) 10 MG tablet Take 1 tablet (10 mg total) by mouth 2 (two) times daily as needed for muscle spasms. 05/08/14   Tatyana Kirichenko, PA-C  ondansetron (ZOFRAN ODT) 4 MG disintegrating tablet Take 1 tablet (4 mg total) by mouth every 8 (eight) hours as needed for nausea or vomiting. 04/04/13   Renne Crigler, PA-C  traMADol (ULTRAM) 50 MG tablet Take 1 tablet (50 mg total) by mouth every 6 (six) hours as needed. 04/04/13   Renne Crigler, PA-C    Allergies  Review of patient's allergies indicates no known allergies.  Triage Vitals: BP 114/63 mmHg  Pulse 103  Temp(Src) 98.2 F (36.8 C) (Oral)  Resp 16  Ht 5\' 6"  (1.676 m)  Wt 110 lb (49.896 kg)  BMI 17.76 kg/m2  SpO2 100%  LMP  (LMP Unknown)  Physical Exam  Constitutional: She is oriented to person, place, and time. She appears well-developed and well-nourished.  HENT:  Head: Normocephalic and atraumatic.  Eyes: Pupils are equal, round, and reactive to light.  Neck: Normal range of motion. Neck supple.  Cardiovascular: Normal rate, regular rhythm and normal heart sounds.   Pulmonary/Chest: Effort normal and breath sounds normal. No respiratory distress. She has no wheezes. She has no rales. She exhibits no tenderness.  Abdominal: Soft. Bowel sounds are normal. There is no tenderness.  There is no rebound and no guarding.  Gravid abdomen to umbilicus.  Mild generalized tenderness to abdomen  Musculoskeletal: Normal range of motion. She exhibits no edema.  Lymphadenopathy:    She has no cervical adenopathy.  Neurological: She is alert and oriented to person, place, and time.  Skin: Skin is warm and dry. No rash noted.  Psychiatric: She has a normal mood and affect.      ED Course  Procedures   DIAGNOSTIC STUDIES: Oxygen Saturation is 100% on RA, normal by my interpretation.    COORDINATION OF CARE: 6:387PM Discussed treatment plan which includes lab work, [place], and IVF with pt at bedside and pt agreed to plan.  I, Rolan Bucco, MD, personally reviewed and evaluated these images and lab results as part of my medical decision-making.  Results for orders placed or performed during the hospital encounter of 11/13/14  Urinalysis, Routine w reflex microscopic (not at [facility])  Result Value Ref Range   Color, Urine YELLOW YELLOW   APPearance CLEAR CLEAR   Specific Gravity, Urine 1.015 1.005 - 1.030   pH 7.0 5.0 - 8.0   Glucose, UA NEGATIVE NEGATIVE mg/dL   Hgb urine dipstick NEGATIVE NEGATIVE   Bilirubin Urine NEGATIVE NEGATIVE   Ketones, ur NEGATIVE NEGATIVE mg/dL   Protein, ur NEGATIVE NEGATIVE mg/dL   Urobilinogen, UA 1.0 0.0 - 1.0 mg/dL   Nitrite NEGATIVE NEGATIVE   Leukocytes, UA TRACE (A) NEGATIVE  Urine microscopic-add on  Result Value Ref Range   Squamous Epithelial / LPF RARE RARE   WBC, UA 3-6 <3 WBC/hpf   Bacteria, UA FEW (A) RARE   Urine-Other MUCOUS PRESENT    US Ob Limited  11/13/2014   CLINICAL DATA:  Subacute onset of right adnexal pain, radiating to the midline pelvis. Initial encounter.  EXAM: LIMITED OBSTETRIC ULTRASOUND  FINDINGS: Number of Fetuses: 1  Heart Rate:  144 bpm  Movement: Yes  Presentation: Breech  Placental Location: Posterior  Previa: No  Amniotic Fluid (Subjective):  Within normal limits.  BPD:  4.43 cm     19 w 3 d  MATERNAL FINDINGS:  Cervix:  Appears closed.  Uterus/Adnexae: At the site of the patient's symptoms at the right adnexa, there is a large clump of dilated veins, compatible with ovarian varices and likely venous congestion. Per patient recollection, she had surgery at the right adnexa in 2010 to remove thrombus.  IMPRESSION: 1. Single live intrauterine pregnancy noted, with a biparietal diameter of 4.4  cm, corresponding to a gestational age of [redacted] weeks 3 days. This matches the gestational age of [redacted] weeks 6 days by LMP, reflecting an estimated date of delivery of April 03, 2015. 2. No evidence of placenta previa.  The cervix remains closed. 3. At the site of the patient's symptoms at the right adnexa, there is a large clump of dilated veins, compatible with ovarian varices and likely venous congestion. 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  This exam is performed on an emergent basis and does not comprehensively evaluate fetal size, dating, or anatomy; follow-up complete OB US should be considered if further fetal assessment is warranted.   Electronically Signed   By: Roanna Raider M.D.   On: 11/13/2014 19:30    Imaging Review US Ob Limited  11/13/2014   CLINICAL DATA:  Subacute onset of right adnexal pain, radiating to the midline pelvis. Initial encounter.  EXAM: LIMITED OBSTETRIC ULTRASOUND  FINDINGS: Number of Fetuses: 1  Heart Rate:  144 bpm  Movement: Yes  Presentation: Breech  Placental Location: Posterior  Previa: No  Amniotic Fluid (Subjective):  Within normal limits.  BPD:  4.43 cm     19 w 3 d  MATERNAL FINDINGS:  Cervix:  Appears closed.  Uterus/Adnexae: At the site of the patient's symptoms at the right adnexa, there is a large clump of dilated veins, compatible with ovarian varices and likely venous congestion. Per patient recollection, she had surgery at the right adnexa in 2010 to remove thrombus.  IMPRESSION: 1. Single live intrauterine pregnancy noted, with a biparietal diameter of 4.4 cm, corresponding to a gestational age of [redacted] weeks 3 days. This matches the gestational age of [redacted] weeks 6 days by LMP, reflecting an estimated date of delivery of April 03, 2015. 2. No evidence of placenta previa.  The cervix remains closed. 3. At the site of the patient's symptoms at  the right adnexa, there is a large clump of dilated veins, compatible with ovarian varices and likely venous congestion. 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  This exam is performed on an emergent basis and does not comprehensively evaluate fetal size, dating, or anatomy; follow-up complete OB US should be considered if further fetal assessment is warranted.   Electronically Signed   By: Roanna Raider M.D.   On: 11/13/2014 19:30    EKG Interpretation None      MDM   Final diagnoses:  Abdominal pain in pregnancy   Patient is given IV fluids. She feels better. The pain has resolved. Her pelvic exam was unremarkable. The cervix is closed without significant discharge. There is no evidence of premature rupture of membranes. There is no bleeding. There is good fetal heart tones and movement. There is no evidence of placenta previa. She was discharged home in good condition. She was advised and pelvic rest. She was advised to continue to increase her fluid intake. She was advised to follow-up with her OB/GYN on Monday if her symptoms have not improved. Her OB/GYN is located in [facility]. She was advised to return to the emergency room at [facility] hospital if her symptoms worsen.  I personally performed the services described in this documentation, which was scribed in my presence.  The recorded information has been reviewed and considered.    Rolan Bucco, MD 11/13/14 [phone]

## 2014-11-13 NOTE — ED Notes (Addendum)
Pregnant [redacted] weeks 6 Days  EDD 04/03/2015.  Abdominal pain that started last night.  Reports positive fetal movement.

## 2014-11-15 LAB — URINE CULTURE
Culture: NO GROWTH
Special Requests: NORMAL

## 2016-12-05 ENCOUNTER — Emergency Department (HOSPITAL_BASED_OUTPATIENT_CLINIC_OR_DEPARTMENT_OTHER): Payer: Self-pay

## 2016-12-05 ENCOUNTER — Emergency Department (HOSPITAL_BASED_OUTPATIENT_CLINIC_OR_DEPARTMENT_OTHER)
Admission: EM | Admit: 2016-12-05 | Discharge: 2016-12-05 | Disposition: A | Discharge status: Home / Self Care | Payer: Self-pay | Attending: Emergency Medicine | Admitting: Emergency Medicine

## 2016-12-05 ENCOUNTER — Encounter (HOSPITAL_BASED_OUTPATIENT_CLINIC_OR_DEPARTMENT_OTHER): Payer: Self-pay | Admitting: Emergency Medicine

## 2016-12-05 DIAGNOSIS — F17200 Nicotine dependence, unspecified, uncomplicated: Secondary | ICD-10-CM | POA: Insufficient documentation

## 2016-12-05 DIAGNOSIS — G8929 Other chronic pain: Secondary | ICD-10-CM

## 2016-12-05 DIAGNOSIS — N83201 Unspecified ovarian cyst, right side: Secondary | ICD-10-CM | POA: Insufficient documentation

## 2016-12-05 DIAGNOSIS — R109 Unspecified abdominal pain: Secondary | ICD-10-CM

## 2016-12-05 LAB — CBC WITH DIFFERENTIAL/PLATELET
BASOS PCT: 0 %
Basophils Absolute: 0 10*3/uL (ref 0.0–0.1)
Eosinophils Absolute: 0.1 10*3/uL (ref 0.0–0.7)
Eosinophils Relative: 1 %
HCT: 33.4 % — ABNORMAL LOW (ref 36.0–46.0)
Hemoglobin: 11 g/dL — ABNORMAL LOW (ref 12.0–15.0)
LYMPHS PCT: 38 %
Lymphs Abs: 2.3 10*3/uL (ref 0.7–4.0)
MCH: 27 pg (ref 26.0–34.0)
MCHC: 32.9 g/dL (ref 30.0–36.0)
MCV: 81.9 fL (ref 78.0–100.0)
MONO ABS: 0.7 10*3/uL (ref 0.1–1.0)
Monocytes Relative: 12 %
NEUTROS ABS: 3 10*3/uL (ref 1.7–7.7)
Neutrophils Relative %: 49 %
Platelets: 223 10*3/uL (ref 150–400)
RBC: 4.08 MIL/uL (ref 3.87–5.11)
RDW: 14.9 % (ref 11.5–15.5)
WBC: 6.1 10*3/uL (ref 4.0–10.5)

## 2016-12-05 LAB — COMPREHENSIVE METABOLIC PANEL
ALBUMIN: 3.7 g/dL (ref 3.5–5.0)
ALK PHOS: 42 U/L (ref 38–126)
ALT: 25 U/L (ref 14–54)
ANION GAP: 6 (ref 5–15)
AST: 29 U/L (ref 15–41)
BUN: 7 mg/dL (ref 6–20)
CALCIUM: 8.5 mg/dL — AB (ref 8.9–10.3)
CHLORIDE: 107 mmol/L (ref 101–111)
CO2: 25 mmol/L (ref 22–32)
Creatinine, Ser: 0.59 mg/dL (ref 0.44–1.00)
GFR calc Af Amer: >60 mL/min (ref >60)
GFR calc non Af Amer: >60 mL/min (ref >60)
GLUCOSE: 83 mg/dL (ref 65–99)
Potassium: 3.4 mmol/L — ABNORMAL LOW (ref 3.5–5.1)
SODIUM: 138 mmol/L (ref 135–145)
Total Bilirubin: 0.3 mg/dL (ref 0.3–1.2)
Total Protein: 6.8 g/dL (ref 6.5–8.1)

## 2016-12-05 LAB — WET PREP, GENITAL
Sperm: NONE SEEN
Trich, Wet Prep: NONE SEEN
Yeast Wet Prep HPF POC: NONE SEEN

## 2016-12-05 LAB — URINALYSIS, ROUTINE W REFLEX MICROSCOPIC
BILIRUBIN URINE: NEGATIVE
GLUCOSE, UA: NEGATIVE mg/dL
Hgb urine dipstick: NEGATIVE
KETONES UR: NEGATIVE mg/dL
LEUKOCYTES UA: NEGATIVE
Nitrite: NEGATIVE
PH: 6.5 (ref 5.0–8.0)
Protein, ur: NEGATIVE mg/dL
Specific Gravity, Urine: 1.015 (ref 1.005–1.030)

## 2016-12-05 LAB — PREGNANCY, URINE: Preg Test, Ur: NEGATIVE

## 2016-12-05 IMAGING — CT CT ABD-PELV W/ CM
2 of 4 series · 15 of 46 positions shown, 17 images · IV contrast (APPLIED)
Comparison: Abdomen radiographs from earlier on the same day.

CLINICAL DATA: Four weeks of worsening low back pain and pelvic
pain.

EXAM:
CT ABDOMEN AND PELVIS WITH CONTRAST
TECHNIQUE: Multidetector CT imaging of the abdomen and pelvis was performed
using the standard protocol following bolus administration of
intravenous contrast.
CONTRAST:  100mL [XT] IOPAMIDOL ([XT]) INJECTION 61%

[Series 2: axial st · axial · 0.57mm/px · z∈[+789,+1184]mm · 12 of 87 slices shown, 14 images]
[im 4/87  soft-tissue]
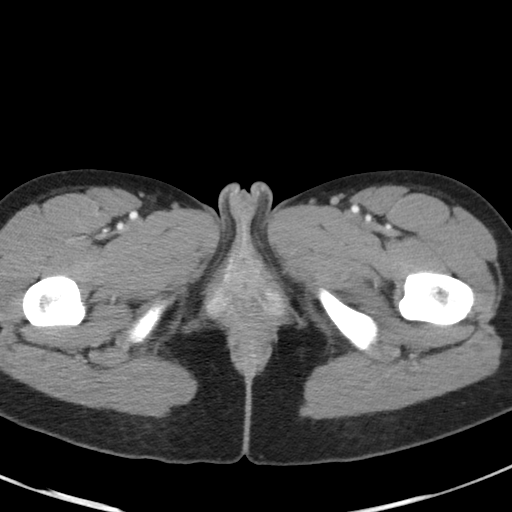
[im 4/87  bone]
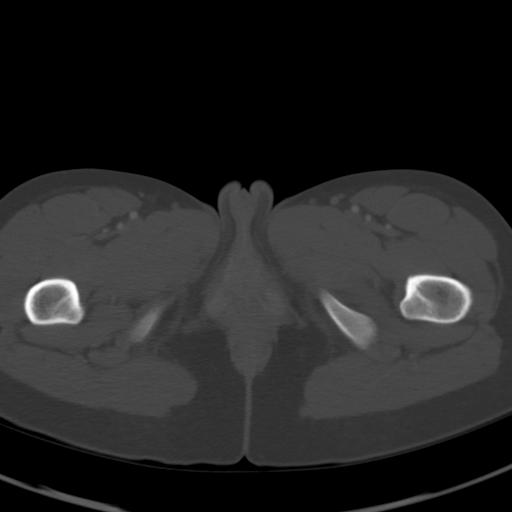
[im 11/87  soft-tissue]
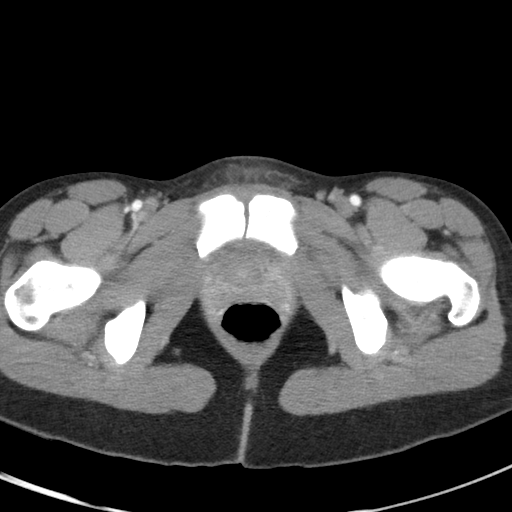
[im 18/87  soft-tissue]
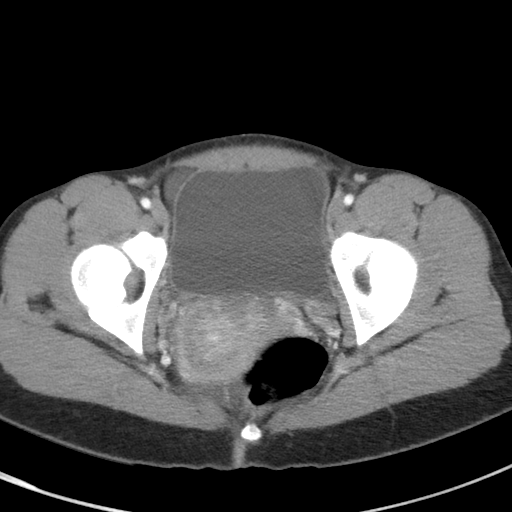
[im 26/87  soft-tissue]
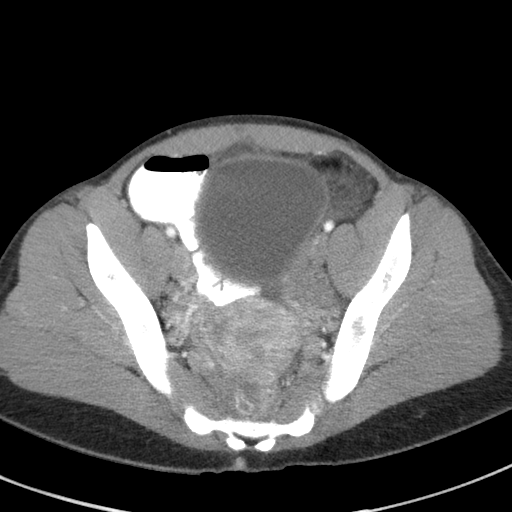
[im 33/87  soft-tissue]
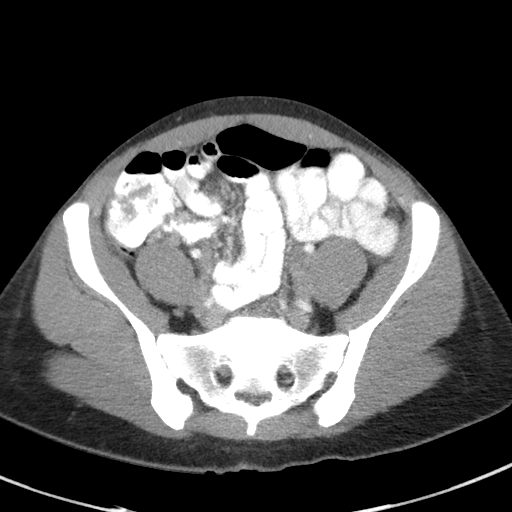
[im 40/87  soft-tissue]
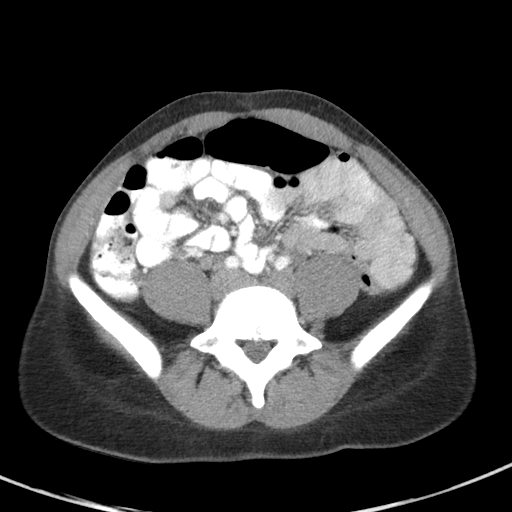
[im 47/87  soft-tissue]
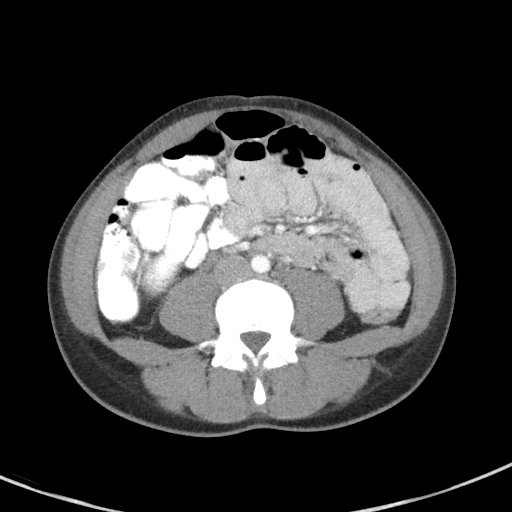
[im 54/87  soft-tissue]
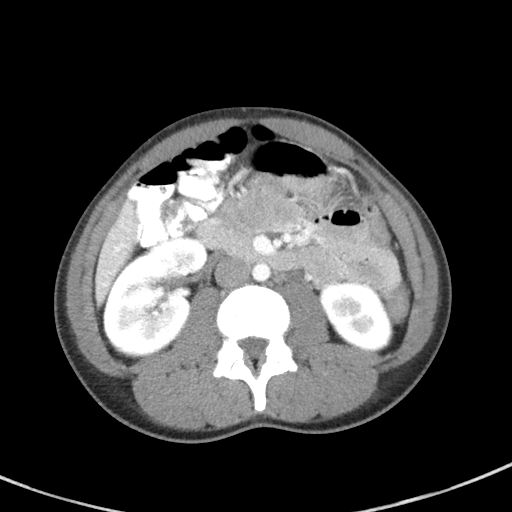
[im 61/87  soft-tissue]
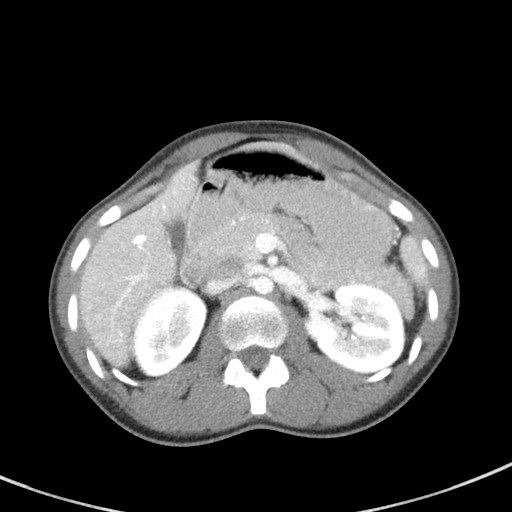
[im 61/87  bone]
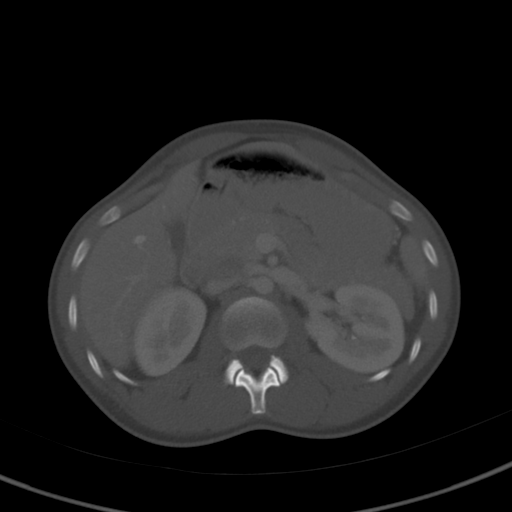
[im 69/87  soft-tissue]
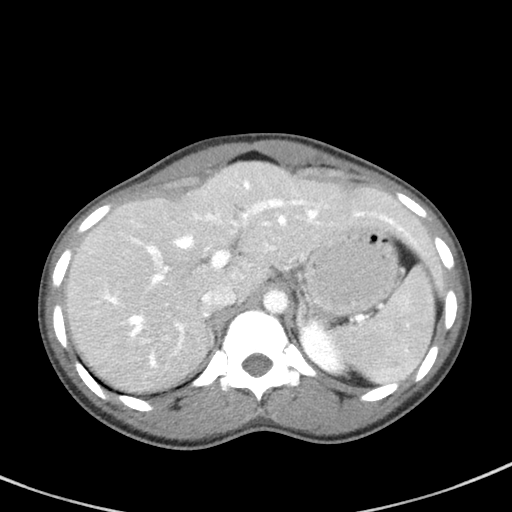
[im 76/87  soft-tissue]
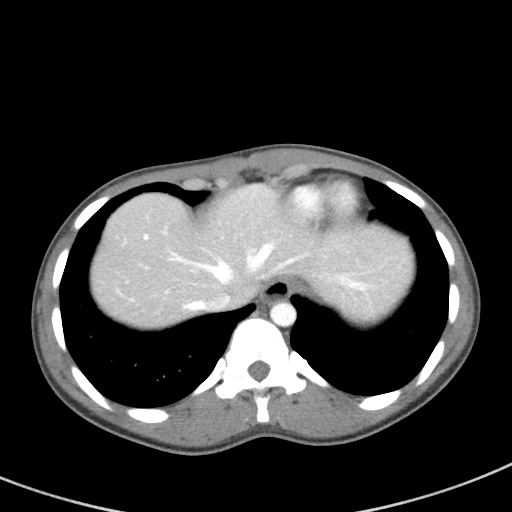
[im 83/87  soft-tissue]
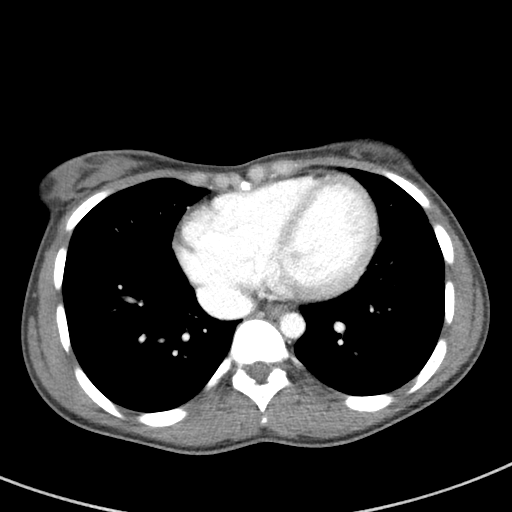

[Series 5: coronal st · coronal · 0.55mm/px · 3 of 70 slices shown]
[im 24/70  soft-tissue]
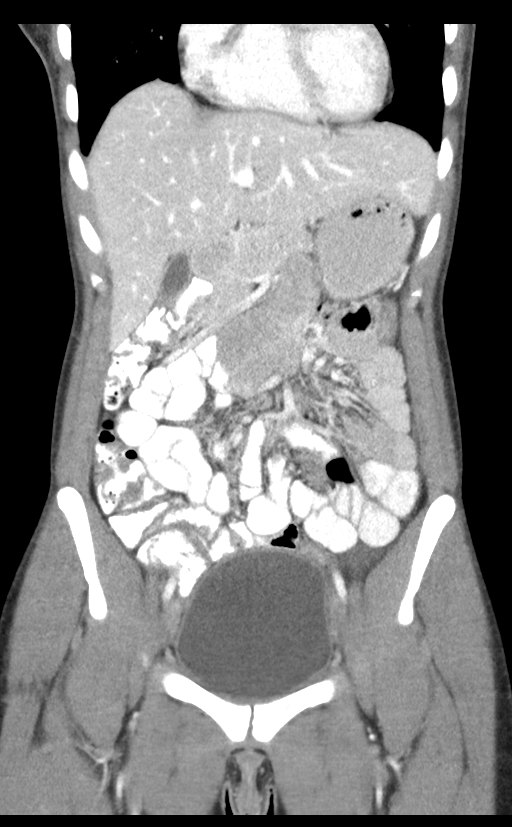
[im 31/70  soft-tissue]
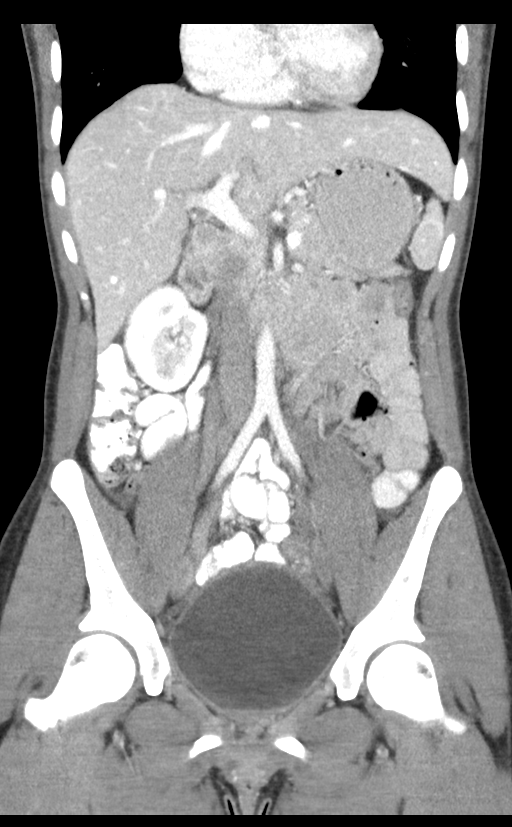
[im 39/70  soft-tissue]
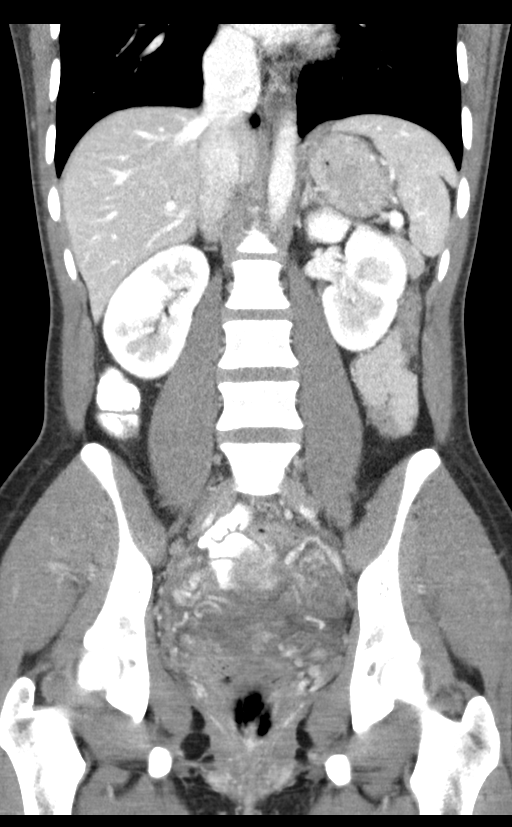

[15 of 46 positions shown; findings below may reference images not displayed]

FINDINGS: Lower chest: Top-normal sized heart. Trace fluid in the distal
esophagus may reflect reflux. Lung bases are nonacute.

Hepatobiliary: No focal liver abnormality is seen. No gallstones,
gallbladder wall thickening, or biliary dilatation.

Pancreas: Normal

Spleen: Normal

Adrenals/Urinary Tract: Normal bilateral adrenal glands. Tiny 3 mm
hypodensity in the interpolar right kidney statistically consistent
with a cyst but too small to further characterize. Physiologic
distention of the urinary bladder without focal mural thickening or
calculus.

Stomach/Bowel: No bowel obstruction or inflammation.
Normal-appearing appendix.

Vascular/Lymphatic: No significant vascular findings are present. No
enlarged abdominal or pelvic lymph nodes.

Reproductive: There appears be a peripherally enhancing 2.2 x 2.3 x
0.6 cm hemorrhagic cyst or corpus luteum associated with the right
ovary. The right ovary seen in the cul-de-sac. The left ovary is
unremarkable. No uterine mass is noted. Small amount of free fluid
is present in the cul-de-sac.

Other: No abdominal wall hernia or abnormality. No abdominopelvic
ascites. No free air nor free fluid.

Musculoskeletal: No acute or significant osseous findings.
IMPRESSION: Hemorrhagic right ovarian cyst or corpus luteum noted in the
cul-de-sac with adjacent small amount of free fluid. Otherwise
negative exam.

## 2016-12-05 MED ORDER — DICYCLOMINE HCL 20 MG PO TABS: 20.0000 mg | ORAL_TABLET | Freq: Two times a day (BID) | ORAL | 0 refills | Status: DC

## 2016-12-05 MED ORDER — ONDANSETRON HCL 4 MG/2ML IJ SOLN: 4.0000 mg | Freq: Once | INTRAMUSCULAR | Status: DC

## 2016-12-05 MED ORDER — ONDANSETRON 4 MG PO TBDP: 4.0000 mg | ORAL_TABLET | Freq: Three times a day (TID) | ORAL | 0 refills | Status: DC | PRN

## 2016-12-05 MED ORDER — IOPAMIDOL (ISOVUE-300) INJECTION 61%
100.0000 mL | Freq: Once | INTRAVENOUS | Status: AC | PRN
  Administered 2016-12-05: 100 mL via INTRAVENOUS

## 2016-12-05 MED ORDER — METOCLOPRAMIDE HCL 5 MG/ML IJ SOLN: 10.0000 mg | Freq: Once | INTRAMUSCULAR | Status: DC

## 2016-12-05 MED ORDER — NAPROXEN 500 MG PO TABS: 500.0000 mg | ORAL_TABLET | Freq: Two times a day (BID) | ORAL | 0 refills | Status: DC

## 2016-12-05 MED ORDER — ASPIRIN 81 MG PO CHEW: 324.0000 mg | CHEWABLE_TABLET | Freq: Once | ORAL | Status: DC

## 2016-12-05 MED ORDER — ONDANSETRON HCL 4 MG/2ML IJ SOLN
4.0000 mg | Freq: Once | INTRAMUSCULAR | Status: AC
  Administered 2016-12-05: 4 mg via INTRAVENOUS

## 2016-12-05 MED ORDER — ONDANSETRON HCL 4 MG/2ML IJ SOLN
INTRAMUSCULAR | Status: AC
  Filled 2016-12-05: qty 2

## 2016-12-05 MED ORDER — FENTANYL CITRATE (PF) 100 MCG/2ML IJ SOLN: 50.0000 ug | Freq: Once | INTRAMUSCULAR | Status: DC

## 2016-12-05 NOTE — ED Notes (Signed)
Patient transported to CT 

## 2016-12-05 NOTE — ED Notes (Signed)
ED Provider at bedside. 

## 2016-12-05 NOTE — ED Provider Notes (Signed)
[facility] Note   CSN: [id number] Arrival date & time: 12/05/16  1033     History   Chief Complaint Chief Complaint  Patient presents with  . Back Pain  . Pelvic Pain    HPI Janet Walton is a 26 y.o. female who presents emergency Department with 4 weeks of worsening low back pain and pelvic pain. She endorses distention of her lower abdomen. She has been defecating normally and denies constipation. Patient does endorse urinary urinary frequency but denies any dyspareunia or vaginal discharge. She has had unprotected sexual intercourse in the past few weeks. She denies concern for STI. She denies vaginal bleeding. She denies fevers, chills.  HPI  No past medical history on file.  There are no active problems to display for this patient.   Past Surgical History:  Procedure Laterality Date  . TONSILLECTOMY      OB History    Gravida Para Term Preterm AB Living   1             SAB TAB Ectopic Multiple Live Births                   Home Medications    Prior to Admission medications   Not on File    Family History No family history on file.  Social History Social History  Substance Use Topics  . Smoking status: Current Every Day Smoker  . Smokeless tobacco: Never Used  . Alcohol use No     Allergies   Patient has no known allergies.   Review of Systems Review of Systems  Ten systems reviewed and are negative for acute change, except as noted in the HPI.   Physical Exam Updated Vital Signs BP 118/67   Pulse 87   Temp 98.5 F (36.9 C)   Resp 16   Ht 5\' 6"  (1.676 m)   Wt 50.3 kg (111 lb)   LMP 11/17/2016   SpO2 100%   BMI 17.92 kg/m   Physical Exam  Constitutional: She is oriented to person, place, and time. She appears well-developed and well-nourished. No distress.  HENT:  Head: Normocephalic and atraumatic.  Eyes: Conjunctivae are normal. No scleral icterus.  Neck: Normal range of motion.  Cardiovascular:  Normal rate, regular rhythm and normal heart sounds.  Exam reveals no gallop and no friction rub.   No murmur heard. Pulmonary/Chest: Effort normal and breath sounds normal. No respiratory distress.  Abdominal: Soft. Bowel sounds are normal. She exhibits distension. She exhibits no mass. There is tenderness (mild suprapubic tenderness). There is no guarding.  Genitourinary:  Genitourinary Comments: Pelvic exam: normal external genitalia, vulva, vagina, cervix, uterus and adnexa.   Neurological: She is alert and oriented to person, place, and time.  Skin: Skin is warm and dry. She is not diaphoretic.  Psychiatric: Her behavior is normal.  Nursing note and vitals reviewed.    ED Treatments / Results  Labs (all labs ordered are listed, but only abnormal results are displayed) Labs Reviewed  WET PREP, GENITAL  PREGNANCY, URINE  URINALYSIS, ROUTINE W REFLEX MICROSCOPIC  GC/CHLAMYDIA PROBE AMP ([place]) [facility]    EKG  EKG Interpretation None       Radiology No results found.  Procedures Procedures (including critical care time)  Medications Ordered in ED Medications - No data to display   Initial Impression / Assessment and Plan / ED Course  I have reviewed the triage vital signs and the nursing notes.  Pertinent labs & imaging results that were available during my care of the patient were reviewed by me and considered in my medical decision making (see chart for details).     Patient with benign pelvic examination, UA is without sign of infection. She does have abdominal distention but there is no evidence of large stool burden on her 1 view abdomen. Given her progressively worsening symptoms over the past few weeks until CT scan of the patient's abdomen to rule out any masses or other abnormalities. I've given signed out to PA [facility] who will assume care of the patient  Final Clinical Impressions(s) / ED Diagnoses   Final diagnoses:  None    New  Prescriptions New Prescriptions   No medications on file     Arthor Captain, PA-C 12/05/16 1745    Gwyneth Sprout, MD 12/05/16 2112

## 2016-12-05 NOTE — ED Triage Notes (Signed)
Pt having pelvic and lower back pain for over two weeks.  Pt denies vaginal discharge but admits to unprotected sex.  Pt believes she may be pregnant.  Last bm was this am.

## 2016-12-05 NOTE — Discharge Instructions (Signed)
Please read and follow all provided instructions.  Your diagnoses today include:  1. Chronic abdominal pain     Tests performed today include:  Blood counts and electrolytes  Blood tests to check liver and kidney function  Blood tests to check pancreas function  Urine test to look for infection and pregnancy (in women)  CT scan - shows ovarian cyst on the right side, otherwise normal  Vital signs. See below for your results today.   Medications prescribed:   Bentyl - medication for intestinal cramps and spasms   Naproxen - anti-inflammatory pain medication  Do not exceed 500mg  naproxen every 12 hours, take with food  You have been prescribed an anti-inflammatory medication or NSAID. Take with food. Take smallest effective dose for the shortest duration needed for your pain. Stop taking if you experience stomach pain or vomiting.    Zofran (ondansetron) - for nausea and vomiting  Take any prescribed medications only as directed.  Home care instructions:   Follow any educational materials contained in this packet.  Follow-up instructions: Please follow-up with your primary care provider in the next 7 days for further evaluation of your symptoms.    Return instructions:  SEEK IMMEDIATE MEDICAL ATTENTION IF:  The pain does not go away or becomes severe   A temperature above 101F develops   Repeated vomiting occurs (multiple episodes)   The pain becomes localized to portions of the abdomen. The right side could possibly be appendicitis. In an adult, the left lower portion of the abdomen could be colitis or diverticulitis.   Blood is being passed in stools or vomit (bright red or black tarry stools)   You develop chest pain, difficulty breathing, dizziness or fainting, or become confused, poorly responsive, or inconsolable (young children)  If you have any other emergent concerns regarding your health  Additional Information: Abdominal (belly) pain can be caused  by many things. Your caregiver performed an examination and possibly ordered blood/urine tests and imaging (CT scan, x-rays, ultrasound). Many cases can be observed and treated at home after initial evaluation in the emergency department. Even though you are being discharged home, abdominal pain can be unpredictable. Therefore, you need a repeated exam if your pain does not resolve, returns, or worsens. Most patients with abdominal pain don't have to be admitted to the hospital or have surgery, but serious problems like appendicitis and gallbladder attacks can start out as nonspecific pain. Many abdominal conditions cannot be diagnosed in one visit, so follow-up evaluations are very important.  Your vital signs today were: BP 96/61 (BP Location: Right Arm)    Pulse (!) 55    Temp 98.5 F (36.9 C)    Resp 18    Ht 5\' 6"  (1.676 m)    Wt 50.3 kg (111 lb)    LMP 11/17/2016    SpO2 100%    BMI 17.92 kg/m  If your blood pressure (bp) was elevated above 135/85 this visit, please have this repeated by your doctor within one month. --------------

## 2016-12-05 NOTE — ED Provider Notes (Signed)
[facility] at shift change.  Patient pending CT abdomen/pelvis to evaluate for intermittent lower abdominal pains over the past 4-5 months. Reassuring pelvic exam here.  CT exam was negative except for right-sided hemorrhagic cyst. Patient updated on results. I'm not sure that this explains all of her symptoms. She states that she has seen a gastroenterologist in the past several months but does not have a primary care physician.  Will discharge to home with symptomatically treatment. Will give referrals for primary care. I explained why this would be helpful for continuity of care purposes.  BP 96/61 (BP Location: Right Arm)   Pulse (!) 55   Temp 98.5 F (36.9 C)   Resp 18   Ht 5\' 6"  (1.676 m)   Wt 50.3 kg (111 lb)   LMP 11/17/2016   SpO2 100%   BMI 17.92 kg/m   The patient was urged to return to the Emergency Department immediately with worsening of current symptoms, worsening abdominal pain, persistent vomiting, blood noted in stools, fever, or any other concerns. The patient verbalized understanding.     Renne CriglerGeiple, Kamsiyochukwu Spickler, PA-C 12/05/16 1959    Nira Connardama, Pedro Eduardo, MD 12/06/16 [phone]

## 2016-12-06 LAB — GC/CHLAMYDIA PROBE AMP (~~LOC~~) NOT AT ARMC
Chlamydia: NEGATIVE
NEISSERIA GONORRHEA: NEGATIVE

## 2017-03-22 ENCOUNTER — Ambulatory Visit (HOSPITAL_BASED_OUTPATIENT_CLINIC_OR_DEPARTMENT_OTHER)
Admission: AD | Admit: 2017-03-22 | Discharge: 2017-03-23 | Disposition: A | Discharge status: Home / Self Care | Payer: Self-pay | Source: Ambulatory Visit | Attending: Family Medicine | Admitting: Family Medicine

## 2017-03-22 ENCOUNTER — Encounter (HOSPITAL_BASED_OUTPATIENT_CLINIC_OR_DEPARTMENT_OTHER): Payer: Self-pay | Admitting: Emergency Medicine

## 2017-03-22 ENCOUNTER — Encounter (HOSPITAL_COMMUNITY)
Admission: AD | Disposition: A | Discharge status: Home / Self Care | Payer: Self-pay | Source: Ambulatory Visit | Attending: Physician Assistant

## 2017-03-22 ENCOUNTER — Emergency Department (HOSPITAL_BASED_OUTPATIENT_CLINIC_OR_DEPARTMENT_OTHER): Payer: Self-pay

## 2017-03-22 ENCOUNTER — Other Ambulatory Visit: Payer: Self-pay

## 2017-03-22 DIAGNOSIS — O00102 Left tubal pregnancy without intrauterine pregnancy: Secondary | ICD-10-CM | POA: Insufficient documentation

## 2017-03-22 DIAGNOSIS — Z720 Tobacco use: Secondary | ICD-10-CM

## 2017-03-22 DIAGNOSIS — O00109 Unspecified tubal pregnancy without intrauterine pregnancy: Secondary | ICD-10-CM | POA: Diagnosis present

## 2017-03-22 DIAGNOSIS — K661 Hemoperitoneum: Secondary | ICD-10-CM | POA: Insufficient documentation

## 2017-03-22 DIAGNOSIS — O008 Other ectopic pregnancy without intrauterine pregnancy: Secondary | ICD-10-CM

## 2017-03-22 DIAGNOSIS — Z9889 Other specified postprocedural states: Secondary | ICD-10-CM

## 2017-03-22 DIAGNOSIS — F17200 Nicotine dependence, unspecified, uncomplicated: Secondary | ICD-10-CM | POA: Insufficient documentation

## 2017-03-22 DIAGNOSIS — Z791 Long term (current) use of non-steroidal anti-inflammatories (NSAID): Secondary | ICD-10-CM | POA: Insufficient documentation

## 2017-03-22 HISTORY — DX: Unspecified sexually transmitted disease: A64

## 2017-03-22 HISTORY — PX: LAPAROSCOPY: SHX197

## 2017-03-22 LAB — URINALYSIS, ROUTINE W REFLEX MICROSCOPIC
Bilirubin Urine: NEGATIVE
Glucose, UA: NEGATIVE mg/dL
Ketones, ur: NEGATIVE mg/dL
NITRITE: NEGATIVE
PROTEIN: NEGATIVE mg/dL
SPECIFIC GRAVITY, URINE: 1.02 (ref 1.005–1.030)
pH: 6.5 (ref 5.0–8.0)

## 2017-03-22 LAB — URINALYSIS, MICROSCOPIC (REFLEX)

## 2017-03-22 LAB — HCG, QUANTITATIVE, PREGNANCY: hCG, Beta Chain, Quant, S: 5868 m[IU]/mL — ABNORMAL HIGH (ref <5)

## 2017-03-22 LAB — CBC
HCT: 30.8 % — ABNORMAL LOW (ref 36.0–46.0)
Hemoglobin: 10.3 g/dL — ABNORMAL LOW (ref 12.0–15.0)
MCH: 27.6 pg (ref 26.0–34.0)
MCHC: 33.4 g/dL (ref 30.0–36.0)
MCV: 82.6 fL (ref 78.0–100.0)
PLATELETS: 213 10*3/uL (ref 150–400)
RBC: 3.73 MIL/uL — ABNORMAL LOW (ref 3.87–5.11)
RDW: 14.7 % (ref 11.5–15.5)
WBC: 6.9 10*3/uL (ref 4.0–10.5)

## 2017-03-22 LAB — COMPREHENSIVE METABOLIC PANEL
ALT: 15 U/L (ref 14–54)
ANION GAP: 10 (ref 5–15)
AST: 21 U/L (ref 15–41)
Albumin: 4.5 g/dL (ref 3.5–5.0)
Alkaline Phosphatase: 45 U/L (ref 38–126)
BILIRUBIN TOTAL: 0.7 mg/dL (ref 0.3–1.2)
BUN: 9 mg/dL (ref 6–20)
CHLORIDE: 102 mmol/L (ref 101–111)
CO2: 24 mmol/L (ref 22–32)
Calcium: 9.1 mg/dL (ref 8.9–10.3)
Creatinine, Ser: 0.72 mg/dL (ref 0.44–1.00)
GFR calc Af Amer: >60 mL/min (ref >60)
Glucose, Bld: 152 mg/dL — ABNORMAL HIGH (ref 65–99)
POTASSIUM: 3.2 mmol/L — AB (ref 3.5–5.1)
Sodium: 136 mmol/L (ref 135–145)
TOTAL PROTEIN: 8.3 g/dL — AB (ref 6.5–8.1)

## 2017-03-22 LAB — CBC WITH DIFFERENTIAL/PLATELET
BASOS PCT: 0 %
Basophils Absolute: 0 10*3/uL (ref 0.0–0.1)
Eosinophils Absolute: 0.1 10*3/uL (ref 0.0–0.7)
Eosinophils Relative: 1 %
HEMATOCRIT: 35.4 % — AB (ref 36.0–46.0)
Hemoglobin: 11.8 g/dL — ABNORMAL LOW (ref 12.0–15.0)
Lymphocytes Relative: 34 %
Lymphs Abs: 2.9 10*3/uL (ref 0.7–4.0)
MCH: 27.5 pg (ref 26.0–34.0)
MCHC: 33.3 g/dL (ref 30.0–36.0)
MCV: 82.5 fL (ref 78.0–100.0)
MONO ABS: 0.7 10*3/uL (ref 0.1–1.0)
MONOS PCT: 8 %
NEUTROS ABS: 4.8 10*3/uL (ref 1.7–7.7)
Neutrophils Relative %: 57 %
PLATELETS: 247 10*3/uL (ref 150–400)
RBC: 4.29 MIL/uL (ref 3.87–5.11)
RDW: 14.7 % (ref 11.5–15.5)
WBC: 8.4 10*3/uL (ref 4.0–10.5)

## 2017-03-22 LAB — PREGNANCY, URINE: Preg Test, Ur: POSITIVE — AB

## 2017-03-22 LAB — TYPE AND SCREEN
ABO/RH(D): O POS
Antibody Screen: NEGATIVE

## 2017-03-22 SURGERY — LAPAROSCOPY OPERATIVE
Anesthesia: General | Site: Abdomen | Laterality: Left

## 2017-03-22 MED ORDER — SODIUM CHLORIDE 0.9 % IV BOLUS (SEPSIS)
1000.0000 mL | Freq: Once | INTRAVENOUS | Status: AC
  Administered 2017-03-22: 1000 mL via INTRAVENOUS

## 2017-03-22 MED ORDER — FENTANYL CITRATE (PF) 250 MCG/5ML IJ SOLN
INTRAMUSCULAR | Status: AC
  Filled 2017-03-22: qty 5

## 2017-03-22 MED ORDER — FAMOTIDINE IN NACL 20-0.9 MG/50ML-% IV SOLN
20.0000 mg | Freq: Once | INTRAVENOUS | Status: AC
  Administered 2017-03-22: 20 mg via INTRAVENOUS

## 2017-03-22 MED ORDER — DEXAMETHASONE SODIUM PHOSPHATE 10 MG/ML IJ SOLN
INTRAMUSCULAR | Status: AC
Start: 2017-03-22 — End: 2017-03-22
  Filled 2017-03-22: qty 1

## 2017-03-22 MED ORDER — ROCURONIUM BROMIDE 100 MG/10ML IV SOLN
INTRAVENOUS | Status: AC
  Filled 2017-03-22: qty 1

## 2017-03-22 MED ORDER — HYDROMORPHONE HCL 1 MG/ML IJ SOLN
0.5000 mg | Freq: Once | INTRAMUSCULAR | Status: AC
  Administered 2017-03-22: 0.5 mg via INTRAVENOUS
  Filled 2017-03-22: qty 1

## 2017-03-22 MED ORDER — SCOPOLAMINE 1 MG/3DAYS TD PT72
MEDICATED_PATCH | TRANSDERMAL | Status: AC
  Filled 2017-03-22: qty 1

## 2017-03-22 MED ORDER — MIDAZOLAM HCL 2 MG/2ML IJ SOLN
INTRAMUSCULAR | Status: AC
  Filled 2017-03-22: qty 2

## 2017-03-22 MED ORDER — ONDANSETRON HCL 4 MG/2ML IJ SOLN
INTRAMUSCULAR | Status: AC
  Filled 2017-03-22: qty 2

## 2017-03-22 MED ORDER — SOD CITRATE-CITRIC ACID 500-334 MG/5ML PO SOLN
30.0000 mL | Freq: Once | ORAL | Status: AC
  Administered 2017-03-22: 30 mL via ORAL

## 2017-03-22 MED ORDER — PROPOFOL 10 MG/ML IV BOLUS
INTRAVENOUS | Status: AC
Start: 2017-03-22 — End: 2017-03-22
  Filled 2017-03-22: qty 20

## 2017-03-22 MED ORDER — ONDANSETRON HCL 4 MG/2ML IJ SOLN
4.0000 mg | Freq: Once | INTRAMUSCULAR | Status: AC
  Administered 2017-03-22: 4 mg via INTRAVENOUS

## 2017-03-22 MED ORDER — LIDOCAINE HCL (CARDIAC) 20 MG/ML IV SOLN
INTRAVENOUS | Status: AC
  Filled 2017-03-22: qty 5

## 2017-03-22 MED ORDER — SOD CITRATE-CITRIC ACID 500-334 MG/5ML PO SOLN
ORAL | Status: AC
Start: 2017-03-22 — End: 2017-03-22
  Administered 2017-03-22: 30 mL via ORAL
  Filled 2017-03-22: qty 15

## 2017-03-22 MED ORDER — ONDANSETRON HCL 4 MG/2ML IJ SOLN
INTRAMUSCULAR | Status: DC
Start: 2017-03-22 — End: 2017-03-23
  Filled 2017-03-22: qty 2

## 2017-03-22 MED ORDER — FAMOTIDINE IN NACL 20-0.9 MG/50ML-% IV SOLN
INTRAVENOUS | Status: AC
  Administered 2017-03-22: 20 mg via INTRAVENOUS
  Filled 2017-03-22: qty 50

## 2017-03-22 MED ORDER — MORPHINE SULFATE (PF) 4 MG/ML IV SOLN
INTRAVENOUS | Status: AC
  Administered 2017-03-22: 4 mg
  Filled 2017-03-22: qty 1

## 2017-03-22 SURGICAL SUPPLY — 40 items
APPLICATOR COTTON TIP 6IN STRL (MISCELLANEOUS) IMPLANT
BLADE SURG 15 STRL LF C SS BP (BLADE) ×1 IMPLANT
BLADE SURG 15 STRL SS (BLADE) ×2
CABLE HIGH FREQUENCY MONO STRZ (ELECTRODE) ×3 IMPLANT
DEFOGGER SCOPE WARMER CLEARIFY (MISCELLANEOUS) ×3 IMPLANT
DERMABOND ADVANCED (GAUZE/BANDAGES/DRESSINGS) ×2
DERMABOND ADVANCED .7 DNX12 (GAUZE/BANDAGES/DRESSINGS) ×1 IMPLANT
DRSG OPSITE POSTOP 3X4 (GAUZE/BANDAGES/DRESSINGS) ×3 IMPLANT
DURAPREP 26ML APPLICATOR (WOUND CARE) ×3 IMPLANT
ELECT REM PT RETURN 9FT ADLT (ELECTROSURGICAL) ×3
ELECTRODE REM PT RTRN 9FT ADLT (ELECTROSURGICAL) ×1 IMPLANT
GLOVE BIOGEL PI IND STRL 7.0 (GLOVE) ×2 IMPLANT
GLOVE BIOGEL PI IND STRL 7.5 (GLOVE) ×2 IMPLANT
GLOVE BIOGEL PI INDICATOR 7.0 (GLOVE) ×4
GLOVE BIOGEL PI INDICATOR 7.5 (GLOVE) ×4
GLOVE SURG SS PI 7.0 STRL IVOR (GLOVE) ×3 IMPLANT
GOWN STRL REUS W/TWL LRG LVL3 (GOWN DISPOSABLE) ×9 IMPLANT
LIGASURE VESSEL 5MM BLUNT TIP (ELECTROSURGICAL) IMPLANT
NS IRRIG 1000ML POUR BTL (IV SOLUTION) ×3 IMPLANT
PACK LAPAROSCOPY BASIN (CUSTOM PROCEDURE TRAY) ×3 IMPLANT
PACK TRENDGUARD 450 HYBRID PRO (MISCELLANEOUS) ×1 IMPLANT
PACK TRENDGUARD 600 HYBRD PROC (MISCELLANEOUS) IMPLANT
PAD OB MATERNITY 4.3X12.25 (PERSONAL CARE ITEMS) ×3 IMPLANT
POUCH LAPAROSCOPIC INSTRUMENT (MISCELLANEOUS) ×3 IMPLANT
POUCH SPECIMEN RETRIEVAL 10MM (ENDOMECHANICALS) ×3 IMPLANT
PROTECTOR NERVE ULNAR (MISCELLANEOUS) ×6 IMPLANT
SCISSORS LAP 5X35 DISP (ENDOMECHANICALS) ×3 IMPLANT
SET IRRIG TUBING LAPAROSCOPIC (IRRIGATION / IRRIGATOR) ×3 IMPLANT
SLEEVE ADV FIXATION 5X100MM (TROCAR) IMPLANT
SLEEVE XCEL OPT CAN 5 100 (ENDOMECHANICALS) ×3 IMPLANT
SUT MON AB 4-0 PS1 27 (SUTURE) ×3 IMPLANT
SUT VICRYL 0 UR6 27IN ABS (SUTURE) ×3 IMPLANT
SYR 10ML LL (SYRINGE) IMPLANT
TOWEL OR 17X24 6PK STRL BLUE (TOWEL DISPOSABLE) ×6 IMPLANT
TRAY FOLEY CATH SILVER 14FR (SET/KITS/TRAYS/PACK) ×3 IMPLANT
TRENDGUARD 450 HYBRID PRO PACK (MISCELLANEOUS) ×3
TRENDGUARD 600 HYBRID PROC PK (MISCELLANEOUS)
TROCAR ADV FIXATION 5X100MM (TROCAR) ×3 IMPLANT
TROCAR BALLN 12MMX100 BLUNT (TROCAR) ×3 IMPLANT
TROCAR XCEL NON-BLD 5MMX100MML (ENDOMECHANICALS) ×3 IMPLANT

## 2017-03-22 NOTE — H&P (Addendum)
Obstetrics & Gynecology H&P   Date of Admission: 03/22/2017   Primary OBGYN: [facility]  Reason for Admission: concern for ectopic pregnancy  History of Present Illness: Ms. Janet Walton is a 26 y.o. [id number] (Patient's last menstrual period was 02/10/2017.), with the above CC. PMHx is significant for h/o STIs, tobacco abuse, h/o ex-lap and right tubal surgery.  Patient went to [facility] with VB and pain and +home UPT. U/s showed left sided ectopic with HR with MSD c/w 5/5 wks, small FF. [facility].  No current pain (got IV meds in ED) and no nausea, vomiting.  Pt not using anything for birth control when she got pregnant  ROS: A 12-point review of systems was performed and negative, except as stated in the above HPI.  OBGYN History: As per HPI. OB History  Gravida Para Term Preterm AB Living  4       1 2   SAB TAB Ectopic Multiple Live Births  1       2    # Outcome Date GA Lbr Len/2nd Weight Sex Delivery Anes PTL Lv  4 Current           3 SAB           2 Gravida      Vag-Spont     1 Gravida      Vag-Spont           Past Medical History: Past Medical History:  Diagnosis Date  . STD (sexually transmitted disease)     Past Surgical History: Past Surgical History:  Procedure Laterality Date  . SALPINGOSTOMY Right    [facility], [facility], [facility]. ex-lap (low transverse skin incision)  . TONSILLECTOMY      Family History:  History reviewed. No pertinent family history.   Social History:  Social History   Socioeconomic History  . Marital status: Single    Spouse name: Not on file  . Number of children: Not on file  . Years of education: Not on file  . Highest education level: Not on file  Social Needs  . Financial resource strain: Not on file  . Food insecurity - worry: Not on file  . Food insecurity - inability: Not on file  . Transportation needs - medical: Not on file  . Transportation needs - non-medical: Not on file  Occupational  History  . Not on file  Tobacco Use  . Smoking status: Current Every Day Smoker  . Smokeless tobacco: Never Used  Substance and Sexual Activity  . Alcohol use: No  . Drug use: No  . Sexual activity: Yes  Other Topics Concern  . Not on file  Social History Narrative  . Not on file    Allergy: No Known Allergies  Current Outpatient Medications: Medications Prior to Admission  Medication Sig Dispense Refill Last Dose  . dicyclomine (BENTYL) 20 MG tablet Take 1 tablet (20 mg total) by mouth 2 (two) times daily. Take for abdominal pain or cramping. 20 tablet 0   . naproxen (NAPROSYN) 500 MG tablet Take 1 tablet (500 mg total) by mouth 2 (two) times daily. 20 tablet 0   . ondansetron (ZOFRAN ODT) 4 MG disintegrating tablet Take 1 tablet (4 mg total) by mouth every 8 (eight) hours as needed for nausea or vomiting. 10 tablet 0      Hospital Medications: Current Facility-Administered Medications  Medication Dose Route Frequency Provider Last Rate Last Dose  . famotidine (PEPCID) IVPB 20 mg  premix  20 mg Intravenous Once Levie Heritage, DO 100 mL/hr at 03/22/17 2330 20 mg at 03/22/17 2330  . sodium citrate-citric acid (ORACIT) 500-334 MG/5ML solution           . sodium citrate-citric acid (ORACIT) solution 30 mL  30 mL Oral Once Levie Heritage, DO         Physical Exam:   Current Vital Signs 24h Vital Sign Ranges  T 98.4 F (36.9 C) Temp  Avg: 98.6 F (37 C)  Min: 98.4 F (36.9 C)  Max: 98.7 F (37.1 C)  BP 112/78 BP  Min: 103/67  Max: 122/91  HR 92 Pulse  Avg: 87.3  Min: 79  Max: 92  RR 20 Resp  Avg: 18  Min: 16  Max: 20  SaO2 100 % Not Delivered SpO2  Avg: 100 %  Min: 100 %  Max: 100 %       24 Hour I/O Current Shift I/O  Time Ins Outs No intake/output data recorded. 12/27 1901 - 12/28 0700 In: 1000  Out: -    Patient Vitals for the past 24 hrs:  BP Temp Temp src Pulse Resp SpO2 Height Weight  03/22/17 2319 112/78 98.4 F (36.9 C) Oral - 20 100 % - -  03/22/17  2219 (!) 122/91 98.6 F (37 C) - 92 18 100 % - -  03/22/17 2132 103/67 - - 79 16 100 % - -  03/22/17 1840 121/84 98.7 F (37.1 C) Oral 91 18 100 % 5\' 6"  (1.676 m) 112 lb (50.8 kg)    Body mass index is 18.08 kg/m. General appearance: Well nourished, well developed female in no acute distress.  Neck:  Supple, normal appearance, and no thyromegaly  Cardiovascular: S1, S2 normal, no murmur, rub or gallop, regular rate and rhythm Respiratory:  Clear to auscultation bilateral. Normal respiratory effort Abdomen: positive bowel sounds and no masses, hernias; diffusely non tender to palpation, non distended. Well healed low transverse skin incision Neuro/Psych:  Normal mood and affect.  Skin:  Warm and dry.  Extremities: no clubbing, cyanosis, or edema.    Laboratory: Beta HCG: 5868 O POS  Recent Labs  Lab 03/22/17 2116 03/22/17 2308  WBC 8.4 6.9  HGB 11.8* 10.3*  HCT 35.4* 30.8*  PLT 247 213   Recent Labs  Lab 03/22/17 2116  NA 136  K 3.2*  CL 102  CO2 24  BUN 9  CREATININE 0.72  CALCIUM 9.1  PROT 8.3*  BILITOT 0.7  ALKPHOS 45  ALT 15  AST 21  GLUCOSE 152*    Imaging:  CLINICAL DATA:  Vaginal  bleeding  EXAM: OBSTETRIC <14 WK [place] AND TRANSVAGINAL OB US  TECHNIQUE: Both transabdominal and transvaginal ultrasound examinations were performed for complete evaluation of the gestation as well as the maternal uterus, adnexal regions, and pelvic cul-de-sac. Transvaginal technique was performed to assess early pregnancy.  COMPARISON:  03/08/2017  FINDINGS: Intrauterine gestational sac: Not seen  Yolk sac:  Visible in the left adnexa  Embryo:  Visible in the left adnexa  Cardiac Activity: Visible in the left adnexa  Heart Rate: 60  bpm  MSD:  9.9 mm  =   5w     5d  Subchorionic hemorrhage:  None visualized.  Maternal uterus/adnexae: Ovaries are within normal limits. Small amount of free fluid.  IMPRESSION: 1. Findings consistent with left  adnexal ectopic pregnancy with yolk sac and fetal pole visualized in the left adnexa. 2. Small amount  of free fluid in the pelvis Critical Value/emergent results were called by telephone at the time of interpretation on 03/22/2017 at 10:35 pm to Dr. Bary CastillaOURTENEY MACKUEN , who verbally acknowledged these results.   Electronically Signed   By: Jasmine PangKim  Fujinaga M.D.   On: 03/22/2017 22:35  Assessment: Ms. Janet Walton is a 26 y.o. G4P2 (Patient's last menstrual period was 02/10/2017.) stable with ectopic pregnancy  Plan: D/w her that given HR on u/s and beta level I recommend surgical management. She does desire fertility. Pt states she she had a blood clot removed from her right tube when they thought she had a heterotopic pregnancy in approx 2010 [facility] but that they left the both FTs in place. I told her that if the unaffected tube looks normal, I recommend removal of the affected tube but if the affected tube is the only one present or if the unaffected tube looks abnormal, I recommend trying a salpingostomy on the affected tube. I told her that there's about a 25% chance I'd have to take the tube anyways after an -ostomy procedure but I would try. I also told her of risk of failure with it and with high recurrence rate of future ectopics. I also told her I'd recommend MTx in the PACU if she had the -ostomy procedure. Risk of open procedure also d/w pt. Pt is amenable to plan  Last PO at 2030 today   Can proceed when OR is ready  Cornelia Copaharlie Georgeann Brinkman, Jr. MD Attending Center for [facility](Faculty Practice)

## 2017-03-22 NOTE — ED Notes (Signed)
ED Provider at bedside. 

## 2017-03-22 NOTE — ED Provider Notes (Signed)
[facility] EMERGENCY DEPARTMENT Provider Note   CSN: [id number] Arrival date & time: 03/22/17  1834     History   Chief Complaint Chief Complaint  Patient presents with  . Abdominal Pain  . Vaginal Bleeding    HPI Janet Walton is a [age] y.o. female.  HPI   26 year old female presenting with positive pregnancy test, cramping and vaginal bleeding.  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  Patient presenting today with a positive pregnancy a couple weeks ago went to [facility].  Ultrasound was apparently "too early" to see any IUP.  Patient reports then she started bleeding on Christmas day.  Resolved and then she started bleeding again today.  Patient had mild cramping.  Patient does not know her blood type.  History reviewed. No pertinent past medical history.  There are no active problems to display for this patient.   Past Surgical History:  Procedure Laterality Date  . TONSILLECTOMY      OB History    Gravida Para Term Preterm AB Living   1             SAB TAB Ectopic Multiple Live Births                   Home Medications    Prior to Admission medications   Medication Sig Start Date End Date Taking? Authorizing Provider  dicyclomine (BENTYL) 20 MG tablet Take 1 tablet (20 mg total) by mouth 2 (two) times daily. Take for abdominal pain or cramping. 12/05/16   Renne CriglerGeiple, Joshua, PA-C  naproxen (NAPROSYN) 500 MG tablet Take 1 tablet (500 mg total) by mouth 2 (two) times daily. 12/05/16   Renne CriglerGeiple, Joshua, PA-C  ondansetron (ZOFRAN ODT) 4 MG disintegrating tablet Take 1 tablet (4 mg total) by mouth every 8 (eight) hours as needed for nausea or vomiting. 12/05/16   Renne CriglerGeiple, Joshua, PA-C    Family History No family history on file.  Social History Social History   Tobacco Use  . Smoking status: Current Every Day Smoker  . Smokeless tobacco: Never Used  Substance Use Topics  . Alcohol use: No  . Drug use: No      Allergies   Patient has no known allergies.   Review of Systems Review of Systems  Constitutional: Negative for activity change.  Respiratory: Negative for shortness of breath.   Cardiovascular: Negative for chest pain.  Gastrointestinal: Positive for abdominal pain.     Physical Exam Updated Vital Signs BP 121/84 (BP Location: Right Arm)   Pulse 91   Temp 98.7 F (37.1 C) (Oral)   Resp 18   Ht 5\' 6"  (1.676 m)   Wt 50.8 kg (112 lb)   LMP 02/10/2017   SpO2 100%   Breastfeeding? Unknown   BMI 18.08 kg/m   Physical Exam   ED Treatments / Results  Labs (all labs ordered are listed, but only abnormal results are displayed) Labs Reviewed  PREGNANCY, URINE - Abnormal; Notable for the following components:      Result Value   Preg Test, Ur POSITIVE (*)    All other components within normal limits  URINALYSIS, ROUTINE W REFLEX MICROSCOPIC - Abnormal; Notable for the following components:   Hgb urine dipstick LARGE (*)    Leukocytes, UA SMALL (*)    All other components within normal limits  URINALYSIS, MICROSCOPIC (REFLEX) - Abnormal; Notable for the following components:   Bacteria, UA FEW (*)  Squamous Epithelial / LPF 0-5 (*)    All other components within normal limits  WET PREP, GENITAL  CBC WITH DIFFERENTIAL/PLATELET  COMPREHENSIVE METABOLIC PANEL  HCG, QUANTITATIVE, PREGNANCY  RPR  HIV ANTIBODY (ROUTINE TESTING)  ABO/RH  GC/CHLAMYDIA PROBE AMP ([place]) NOT AT [facility]    EKG  EKG Interpretation None       Radiology No results found.  Procedures Procedures (including critical care time)  CRITICAL CARE Performed by: Arlana Hoveourteney L Keshonda Monsour Total critical care time: 60 minutes Critical care time was exclusive of separately billable procedures and treating other patients. Critical care was necessary to treat or prevent imminent or life-threatening deterioration. Critical care was time spent personally by me on the following activities:  development of treatment plan with patient and/or surrogate as well as nursing, discussions with consultants, evaluation of patient's response to treatment, examination of patient, obtaining history from patient or surrogate, ordering and performing treatments and interventions, ordering and review of laboratory studies, ordering and review of radiographic studies, pulse oximetry and re-evaluation of patient's condition.   Medications Ordered in ED Medications - No data to display   Initial Impression / Assessment and Plan / ED Course  I have reviewed the triage vital signs and the nursing notes.  Pertinent labs & imaging results that were available during my care of the patient were reviewed by me and considered in my medical decision making (see chart for details).     26 year old female presenting with positive pregnancy test, cramping and vaginal bleeding.  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  Patient presenting today with a positive pregnancy a couple weeks ago went [facility].  Ultrasound was apparently "too early" to see any IUP.  Patient reports then she started bleeding on Christmas day.  Resolved and then she started bleeding again today.  Patient had mild cramping.  Patient does not know her blood type.  10:35 PM Patient went to ultrasound.  Ultrasound tech came to inform is that there is likely a ectopic.  Patient returned from ultrasound she started screaming out in pain, writhing.  Called for transfer immediately.  Discussed with Dr. Lodema HongSimpson.  [facility]  Stable vitals prior to transfer.    Final Clinical Impressions(s) / ED Diagnoses   Final diagnoses:  None    ED Discharge Orders    None       Abelino DerrickMackuen, Oshea Percival Lyn, MD 03/25/17 2016

## 2017-03-22 NOTE — MAU Note (Signed)
PT ARRIVED VIA [facility] - WITH [facility] / [facility] -  FROM [facility].

## 2017-03-22 NOTE — ED Triage Notes (Signed)
Low abd cramping and vaginal bleeding intermittently x 3 days. Positive preg test at home. LMP 11/17

## 2017-03-22 NOTE — ED Notes (Signed)
Called [facility](Kim)  [facility].

## 2017-03-23 ENCOUNTER — Inpatient Hospital Stay (HOSPITAL_COMMUNITY): Payer: Self-pay | Admitting: Anesthesiology

## 2017-03-23 ENCOUNTER — Encounter (HOSPITAL_COMMUNITY): Payer: Self-pay | Admitting: Obstetrics and Gynecology

## 2017-03-23 DIAGNOSIS — O00112 Left tubal pregnancy with intrauterine pregnancy: Secondary | ICD-10-CM

## 2017-03-23 LAB — ABO/RH
ABO/RH(D): O POS
ABO/RH(D): O POS

## 2017-03-23 MED ORDER — ROCURONIUM BROMIDE 100 MG/10ML IV SOLN
INTRAVENOUS | Status: DC | PRN
  Administered 2017-03-23: 35 mg via INTRAVENOUS

## 2017-03-23 MED ORDER — SUCCINYLCHOLINE CHLORIDE 20 MG/ML IJ SOLN
INTRAMUSCULAR | Status: DC | PRN
  Administered 2017-03-23: 120 mg via INTRAVENOUS

## 2017-03-23 MED ORDER — OXYCODONE-ACETAMINOPHEN 5-325 MG PO TABS: 1.0000 | ORAL_TABLET | Freq: Four times a day (QID) | ORAL | 0 refills | Status: DC | PRN

## 2017-03-23 MED ORDER — MEPERIDINE HCL 25 MG/ML IJ SOLN
6.2500 mg | INTRAMUSCULAR | Status: DC | PRN
Start: 2017-03-23 — End: 2017-03-23
  Administered 2017-03-23 (×2): 12.5 mg via INTRAVENOUS

## 2017-03-23 MED ORDER — HYDROMORPHONE HCL 1 MG/ML IJ SOLN
0.2500 mg | INTRAMUSCULAR | Status: DC | PRN
  Administered 2017-03-23: 0.5 mg via INTRAVENOUS

## 2017-03-23 MED ORDER — ONDANSETRON HCL 4 MG/2ML IJ SOLN
INTRAMUSCULAR | Status: DC | PRN
  Administered 2017-03-23: 4 mg via INTRAVENOUS

## 2017-03-23 MED ORDER — HYDROMORPHONE HCL 1 MG/ML IJ SOLN
INTRAMUSCULAR | Status: AC
  Filled 2017-03-23: qty 0.5

## 2017-03-23 MED ORDER — SUGAMMADEX SODIUM 200 MG/2ML IV SOLN
INTRAVENOUS | Status: AC
  Filled 2017-03-23: qty 2

## 2017-03-23 MED ORDER — KETOROLAC TROMETHAMINE 30 MG/ML IJ SOLN
INTRAMUSCULAR | Status: DC | PRN
  Administered 2017-03-23: 30 mg via INTRAVENOUS

## 2017-03-23 MED ORDER — SODIUM CHLORIDE 0.9 % IR SOLN
Status: DC | PRN
  Administered 2017-03-23: 3000 mL

## 2017-03-23 MED ORDER — MIDAZOLAM HCL 5 MG/5ML IJ SOLN
INTRAMUSCULAR | Status: DC | PRN
  Administered 2017-03-23: 1 mg via INTRAVENOUS

## 2017-03-23 MED ORDER — MEPERIDINE HCL 25 MG/ML IJ SOLN
INTRAMUSCULAR | Status: AC
  Filled 2017-03-23: qty 1

## 2017-03-23 MED ORDER — LIDOCAINE HCL (CARDIAC) 20 MG/ML IV SOLN
INTRAVENOUS | Status: DC | PRN
  Administered 2017-03-23: 50 mg via INTRAVENOUS

## 2017-03-23 MED ORDER — KETOROLAC TROMETHAMINE 30 MG/ML IJ SOLN
INTRAMUSCULAR | Status: AC
  Filled 2017-03-23: qty 1

## 2017-03-23 MED ORDER — FENTANYL CITRATE (PF) 100 MCG/2ML IJ SOLN
INTRAMUSCULAR | Status: DC | PRN
  Administered 2017-03-23: 100 ug via INTRAVENOUS
  Administered 2017-03-23 (×2): 50 ug via INTRAVENOUS

## 2017-03-23 MED ORDER — BUPIVACAINE HCL 0.5 % IJ SOLN
INTRAMUSCULAR | Status: DC | PRN
  Administered 2017-03-23: 7 mL

## 2017-03-23 MED ORDER — SUGAMMADEX SODIUM 200 MG/2ML IV SOLN
INTRAVENOUS | Status: DC | PRN
  Administered 2017-03-23: 200 mg via INTRAVENOUS

## 2017-03-23 MED ORDER — SUCCINYLCHOLINE CHLORIDE 200 MG/10ML IV SOSY
PREFILLED_SYRINGE | INTRAVENOUS | Status: AC
  Filled 2017-03-23: qty 10

## 2017-03-23 MED ORDER — PROPOFOL 10 MG/ML IV BOLUS
INTRAVENOUS | Status: DC | PRN
  Administered 2017-03-23: 150 mg via INTRAVENOUS

## 2017-03-23 MED ORDER — SCOPOLAMINE 1 MG/3DAYS TD PT72
MEDICATED_PATCH | TRANSDERMAL | Status: DC | PRN
  Administered 2017-03-23: 1 via TRANSDERMAL

## 2017-03-23 MED ORDER — DOCUSATE SODIUM 100 MG PO CAPS: 100.0000 mg | ORAL_CAPSULE | Freq: Two times a day (BID) | ORAL | 2 refills | Status: DC | PRN

## 2017-03-23 MED ORDER — PROMETHAZINE HCL 25 MG/ML IJ SOLN: 6.2500 mg | INTRAMUSCULAR | Status: DC | PRN

## 2017-03-23 MED ORDER — DEXAMETHASONE SODIUM PHOSPHATE 10 MG/ML IJ SOLN
INTRAMUSCULAR | Status: DC | PRN
  Administered 2017-03-23: 10 mg via INTRAVENOUS

## 2017-03-23 MED ORDER — LACTATED RINGERS IV SOLN
INTRAVENOUS | Status: DC | PRN
  Administered 2017-03-23 (×2): via INTRAVENOUS

## 2017-03-23 MED ORDER — BUPIVACAINE HCL (PF) 0.5 % IJ SOLN
INTRAMUSCULAR | Status: DC | PRN
  Administered 2017-03-23: 10 mL

## 2017-03-23 NOTE — Anesthesia Procedure Notes (Signed)
Procedure Name: Intubation Date/Time: 03/23/2017 12:12 AM Performed by: Junious SilkGilbert, Yajayra Feldt, CRNA Pre-anesthesia Checklist: Patient identified, Emergency Drugs available, Suction available, Patient being monitored and Timeout performed Patient Re-evaluated:Patient Re-evaluated prior to induction Oxygen Delivery Method: Circle system utilized Preoxygenation: Pre-oxygenation with 100% oxygen Induction Type: IV induction, Rapid sequence and Cricoid Pressure applied Laryngoscope Size: Miller and 2 Grade View: Grade I Tube type: Oral Tube size: 7.0 mm Number of attempts: 1 Airway Equipment and Method: Stylet Placement Confirmation: ETT inserted through vocal cords under direct vision,  positive ETCO2,  CO2 detector and breath sounds checked- equal and bilateral Secured at: 21 cm Tube secured with: Tape Dental Injury: Teeth and Oropharynx as per pre-operative assessment

## 2017-03-23 NOTE — Anesthesia Preprocedure Evaluation (Signed)
Anesthesia Evaluation  Patient identified by MRN, date of birth, ID band Patient awake    Reviewed: Allergy & Precautions, NPO status , Patient's Chart, lab work & pertinent test results  Airway Mallampati: II  TM Distance: >3 FB Neck ROM: Full    Dental no notable dental hx.    Pulmonary neg pulmonary ROS, Current Smoker,    Pulmonary exam normal breath sounds clear to auscultation       Cardiovascular negative cardio ROS Normal cardiovascular exam Rhythm:Regular Rate:Normal     Neuro/Psych negative neurological ROS  negative psych ROS   GI/Hepatic negative GI ROS, Neg liver ROS,   Endo/Other  negative endocrine ROS  Renal/GU negative Renal ROS     Musculoskeletal negative musculoskeletal ROS (+)   Abdominal   Peds  Hematology negative hematology ROS (+)   Anesthesia Other Findings   Reproductive/Obstetrics negative OB ROS                             Anesthesia Physical Anesthesia Plan  ASA: I and emergent  Anesthesia Plan: General   Post-op Pain Management:    Induction: Intravenous, Rapid sequence and Cricoid pressure planned  PONV Risk Score and Plan: 4 or greater and Ondansetron, Dexamethasone, Midazolam, Scopolamine patch - Pre-op and Treatment may vary due to age or medical condition  Airway Management Planned: Oral ETT  Additional Equipment:   Intra-op Plan:   Post-operative Plan: Extubation in OR  Informed Consent: I have reviewed the patients History and Physical, chart, labs and discussed the procedure including the risks, benefits and alternatives for the proposed anesthesia with the patient or authorized representative who has indicated his/her understanding and acceptance.   Dental advisory given  Plan Discussed with: CRNA  Anesthesia Plan Comments:         Anesthesia Quick Evaluation

## 2017-03-23 NOTE — Op Note (Signed)
Operative Note   03/23/2017  PRE-OP DIAGNOSIS: left fallopian tube ectopic pregnancy with heartbeat at 5/5 weeks   POST-OP DIAGNOSIS: Same. Hemoperitoneum   SURGEON: Surgeon(s) and Role:    * Analiyah Lechuga, Billey Goslingharlie, MD - Primary  ASSISTANT: None  ANESTHESIA: General and local  PROCEDURE: LAPAROSCOPY OPERATIVE WITH LEFT SALPINGECTOMY   ESTIMATED BLOOD LOSS: 10mL for the case; approximately 300mL in the abdomen  DRAINS: indwelling foley 200mL UOP   TOTAL IV FLUIDS: per anesthesia note  SPECIMENS: left fallopian tube with ectopic pregnancy   VTE PROPHYLAXIS: SCDs to the bilateral lower extremities  ANTIBIOTICS: not indicated  COMPLICATIONS: none  DISPOSITION: PACU - hemodynamically stable.  CONDITION: stable  FINDINGS: Exam under anesthesia revealed an anteverted uterus approximately 8 week size, normal shape, and no adnexal masses; she sounded to 10cm. Laparoscopic survey of the abdomen revealed a grossly normal liver, gallbladder, appendix, stomach edge. Approximately 300mL of blood in the cul de sac. Grossly normal uterus, right fallopian tube and bilateral ovaries. Engorged left fallopian tube with blood clot being extruded out the end  PROCEDURE IN DETAIL: The patient was taken to the OR where anesthesia was administed. The patient was positioned in dorsal lithotomy in the [facility] stirrups. The patient was then examined under anesthesia with the above noted findings. The patient was prepped and draped in the normal sterile fashion and foley catheter was placed. A Graves speculum was placed in the vagina and the anterior lip of the cervix was grasped with a single toothed tenaculum.  A Hulka uterine manipulator was then inserted in the uterus and uterine mobility was found to be satisfactory; the speculum and tenaculum were then removed.  After changing gloves, attention was turned to the patient's abdomen where a 10 mm skin incision was made in the umbilical fold, after injection of  local anesthesia. Using the open technique the abdomen was entered and the 10mm port was then placed through the sleeve and the operative laparoscope was introduced into the abdomen with the above noted findings, after inspection of the entry site and then placing the patient in Trendelenburg. Under direct visualization and after injection of local a 5mm port was placed in the suprapubic area and the RLQ. The hemoperitoneum was then suctioned out. Using the Ligasure, a thin omental band going to the anterior abdominal wall was cauterized and cut and the left fallopian tube was serially cauterized and cut in the mesosalpingx. It was then placed in an endocatch bag and removed from the abdomen. The pressure was dropped to 8mmHg and all operative sites were hemostatic. Ports were then removed under direct visualization and the gas released and the umbilical port removed. The fascia there was closed with a 0 vicryl figure of eight stitch and the skin closed with 4-0 monocryl and covered with dermabond; the other sites were closed with just dermabond.    The Hulka was removed with no bleeding noted from the cervix and all other instrumentation was removed from the vagina.  The foley catheter was removed. The patient tolerated the procedure well. All counts were correct x 2. The patient was transferred to the recovery room awake, alert and breathing independently.   Cornelia Copaharlie Curby Carswell, Jr MD Attending Center for [facility](Faculty Practice)

## 2017-03-23 NOTE — Transfer of Care (Signed)
Immediate Anesthesia Transfer of Care Note  Patient: Scientist, [name]  Procedure(s) Performed: LAPAROSCOPY OPERATIVE WITH LEFT SALPINGECTOMY (Left Abdomen)  Patient Location: PACU  Anesthesia Type:General  Level of Consciousness: awake, alert  and oriented  Airway & Oxygen Therapy: Patient Spontanous Breathing and Patient connected to nasal cannula oxygen  Post-op Assessment: Report given to RN and Post -op Vital signs reviewed and stable  Post vital signs: Reviewed and stable HR 95, RR 15, SaO2 99%, BP 11/72  Last Vitals:  Vitals:   03/22/17 2336 03/22/17 2351  BP:  101/69  Pulse:  71  Resp:  16  Temp:    SpO2: 100%     Last Pain:  Vitals:   03/22/17 2359  TempSrc:   PainSc: 0-No pain         Complications: No apparent anesthesia complications

## 2017-03-24 LAB — HIV ANTIBODY (ROUTINE TESTING W REFLEX): HIV SCREEN 4TH GENERATION: NONREACTIVE

## 2017-03-24 LAB — RPR: RPR: NONREACTIVE

## 2017-03-25 NOTE — Anesthesia Postprocedure Evaluation (Signed)
Anesthesia Post Note  Patient: Scientist, [name]  Procedure(s) Performed: LAPAROSCOPY OPERATIVE WITH LEFT SALPINGECTOMY (Left Abdomen)     Patient location during evaluation: PACU Anesthesia Type: General Level of consciousness: sedated and patient cooperative Pain management: pain level controlled Vital Signs Assessment: post-procedure vital signs reviewed and stable Respiratory status: spontaneous breathing Cardiovascular status: stable Anesthetic complications: no    Last Vitals:  Vitals:   03/23/17 0300 03/23/17 0330  BP: 106/77 104/74  Pulse: 87 83  Resp: 14   Temp: 36.8 C   SpO2: 96% 95%    Last Pain:  Vitals:   03/23/17 0330  TempSrc:   PainSc: 4                  Lewie LoronJohn Chrishon Martino

## 2017-03-26 MED FILL — Hydromorphone HCl Inj 2 MG/ML: INTRAMUSCULAR | Qty: 1 | Status: AC

## 2017-04-12 ENCOUNTER — Encounter: Payer: Self-pay | Admitting: Obstetrics and Gynecology

## 2017-04-12 NOTE — Progress Notes (Signed)
Patient did not keep GYN appointment for 04/12/2017.  Ariv Penrod, Jr MD Attending Center for [facility] (Faculty Practice)   

## 2018-02-09 ENCOUNTER — Encounter (HOSPITAL_COMMUNITY): Payer: Self-pay

## 2018-08-30 ENCOUNTER — Encounter (HOSPITAL_BASED_OUTPATIENT_CLINIC_OR_DEPARTMENT_OTHER): Payer: Self-pay | Admitting: Emergency Medicine

## 2018-08-30 ENCOUNTER — Emergency Department (HOSPITAL_BASED_OUTPATIENT_CLINIC_OR_DEPARTMENT_OTHER)
Admission: EM | Admit: 2018-08-30 | Discharge: 2018-08-31 | Disposition: A | Discharge status: Home / Self Care | Payer: Medicaid Other | Attending: Emergency Medicine | Admitting: Emergency Medicine

## 2018-08-30 ENCOUNTER — Other Ambulatory Visit: Payer: Self-pay

## 2018-08-30 DIAGNOSIS — E876 Hypokalemia: Secondary | ICD-10-CM | POA: Diagnosis not present

## 2018-08-30 DIAGNOSIS — F17200 Nicotine dependence, unspecified, uncomplicated: Secondary | ICD-10-CM | POA: Insufficient documentation

## 2018-08-30 DIAGNOSIS — R110 Nausea: Secondary | ICD-10-CM | POA: Insufficient documentation

## 2018-08-30 DIAGNOSIS — R630 Anorexia: Secondary | ICD-10-CM | POA: Insufficient documentation

## 2018-08-30 DIAGNOSIS — R1013 Epigastric pain: Secondary | ICD-10-CM

## 2018-08-30 LAB — PREGNANCY, URINE: Preg Test, Ur: NEGATIVE

## 2018-08-30 MED ORDER — SUCRALFATE 1 GM/10ML PO SUSP
1.0000 g | Freq: Once | ORAL | Status: AC
  Administered 2018-08-31: 1 g via ORAL
  Filled 2018-08-30: qty 10

## 2018-08-30 MED ORDER — ONDANSETRON HCL 4 MG/2ML IJ SOLN
4.0000 mg | Freq: Once | INTRAMUSCULAR | Status: AC
  Administered 2018-08-31: 4 mg via INTRAVENOUS
  Filled 2018-08-30: qty 2

## 2018-08-30 MED ORDER — PANTOPRAZOLE SODIUM 40 MG IV SOLR
40.0000 mg | Freq: Once | INTRAVENOUS | Status: AC
  Administered 2018-08-31: 40 mg via INTRAVENOUS
  Filled 2018-08-30: qty 40

## 2018-08-30 NOTE — ED Triage Notes (Addendum)
Pt reports bilateral abd pain that has worsened over last 3 days. Pt reports nausea and pain is worse after eating.

## 2018-08-30 NOTE — ED Provider Notes (Signed)
[facility] Provider Note: Lowella DellJ. Lane Regenia Erck, MD, FACEP  CSN: [id number] MRN: [id number] ARRIVAL: 08/30/18 at 2336 ROOM: MH10/MH10   CHIEF COMPLAINT  Abdominal Pain   HISTORY OF PRESENT ILLNESS  08/30/18 11:48 PM Janet Walton is a 28 y.o. female who complains of a one-month history of epigastric pain.  She describes the pain is sharp in nature.  It has worsened over the last 3 days and she now rates it an 8 out of 10.  It is worse with palpation or movement or with lying on her left side.  She has had associated decreased appetite and nausea.  Eating worsens her nausea.  She denies diarrhea.   Past Medical History:  Diagnosis Date  . STD (sexually transmitted disease)     Past Surgical History:  Procedure Laterality Date  . LAPAROSCOPY Left 03/22/2017   Procedure: LAPAROSCOPY OPERATIVE WITH LEFT SALPINGECTOMY;  Surgeon: [place] BingPickens, Charlie, MD;  Location: [facility];  Service: Gynecology;  Laterality: Left;  . SALPINGOSTOMY Right    [facility], [facility]. ex-lap (low transverse skin incision)  . TONSILLECTOMY      No family history on file.  Social History   Tobacco Use  . Smoking status: Current Every Day Smoker  . Smokeless tobacco: Never Used  Substance Use Topics  . Alcohol use: No  . Drug use: No    Prior to Admission medications   Not on File    Allergies Patient has no known allergies.   REVIEW OF SYSTEMS  Negative except as noted here or in the History of Present Illness.   PHYSICAL EXAMINATION  Initial Vital Signs Blood pressure 127/85, pulse (!) 107, temperature 98.1 F (36.7 C), temperature source Oral, resp. rate 16, height 5\' 6"  (1.676 m), weight 54 kg, last menstrual period 08/16/2018, SpO2 100 %, unknown if currently breastfeeding.  Examination General: Well-developed, well-nourished female in no acute distress; appearance consistent with age of record HENT: normocephalic; atraumatic Eyes: pupils equal, round and reactive to  light; extraocular muscles intact Neck: supple Heart: regular rate and rhythm Lungs: clear to auscultation bilaterally Abdomen: soft; nondistended; epigastric tenderness; negative Murphy sign; no masses or hepatosplenomegaly; bowel sounds present Extremities: No deformity; full range of motion; pulses normal Neurologic: Awake, alert and oriented; motor function intact in all extremities and symmetric; no facial droop Skin: Warm and dry Psychiatric: Normal mood and affect   RESULTS  Summary of this visit's results, reviewed by myself:   EKG Interpretation  Date/Time:    Ventricular Rate:    PR Interval:    QRS Duration:   QT Interval:    QTC Calculation:   R Axis:     Text Interpretation:        Laboratory Studies: Results for orders placed or performed during the hospital encounter of 08/30/18 (from the past 24 hour(s))  Urinalysis, Routine w reflex microscopic     Status: Abnormal   Collection Time: 08/30/18 11:48 PM  Result Value Ref Range   Color, Urine YELLOW YELLOW   APPearance CLOUDY (A) CLEAR   Specific Gravity, Urine 1.015 1.005 - 1.030   pH 6.5 5.0 - 8.0   Glucose, UA NEGATIVE NEGATIVE mg/dL   Hgb urine dipstick NEGATIVE NEGATIVE   Bilirubin Urine NEGATIVE NEGATIVE   Ketones, ur 15 (A) NEGATIVE mg/dL   Protein, ur NEGATIVE NEGATIVE mg/dL   Nitrite NEGATIVE NEGATIVE   Leukocytes,Ua SMALL (A) NEGATIVE  Pregnancy, urine     Status: None   Collection Time: 08/30/18 11:48  PM  Result Value Ref Range   Preg Test, Ur NEGATIVE NEGATIVE  Urinalysis, Microscopic (reflex)     Status: Abnormal   Collection Time: 08/30/18 11:48 PM  Result Value Ref Range   RBC / HPF 0-5 0 - 5 RBC/hpf   WBC, UA 6-10 0 - 5 WBC/hpf   Bacteria, UA MANY (A) NONE SEEN   Squamous Epithelial / LPF 11-20 0 - 5   Mucus PRESENT   CBC with Differential/Platelet     Status: None   Collection Time: 08/31/18 12:07 AM  Result Value Ref Range   WBC 7.2 4.0 - 10.5 K/uL   RBC 4.58 3.87 - 5.11  MIL/uL   Hemoglobin 12.7 12.0 - 15.0 g/dL   HCT 01.7 79.3 - 90.3 %   MCV 85.6 80.0 - 100.0 fL   MCH 27.7 26.0 - 34.0 pg   MCHC 32.4 30.0 - 36.0 g/dL   RDW 00.9 23.3 - 00.7 %   Platelets 261 150 - 400 K/uL   nRBC 0.0 0.0 - 0.2 %   Neutrophils Relative % 48 %   Neutro Abs 3.5 1.7 - 7.7 K/uL   Lymphocytes Relative 41 %   Lymphs Abs 3.0 0.7 - 4.0 K/uL   Monocytes Relative 10 %   Monocytes Absolute 0.7 0.1 - 1.0 K/uL   Eosinophils Relative 1 %   Eosinophils Absolute 0.1 0.0 - 0.5 K/uL   Basophils Relative 0 %   Basophils Absolute 0.0 0.0 - 0.1 K/uL   Immature Granulocytes 0 %   Abs Immature Granulocytes 0.03 0.00 - 0.07 K/uL  Comprehensive metabolic panel     Status: Abnormal   Collection Time: 08/31/18 12:07 AM  Result Value Ref Range   Sodium 135 135 - 145 mmol/L   Potassium 2.9 (L) 3.5 - 5.1 mmol/L   Chloride 100 98 - 111 mmol/L   CO2 25 22 - 32 mmol/L   Glucose, Bld 90 70 - 99 mg/dL   BUN 9 6 - 20 mg/dL   Creatinine, Ser 6.22 0.44 - 1.00 mg/dL   Calcium 9.2 8.9 - 63.3 mg/dL   Total Protein 8.4 (H) 6.5 - 8.1 g/dL   Albumin 4.8 3.5 - 5.0 g/dL   AST 21 15 - 41 U/L   ALT 17 0 - 44 U/L   Alkaline Phosphatase 41 38 - 126 U/L   Total Bilirubin 1.2 0.3 - 1.2 mg/dL   GFR calc non Af Amer >60 >60 mL/min   GFR calc Af Amer >60 >60 mL/min   Anion gap 10 5 - 15  Lipase, blood     Status: None   Collection Time: 08/31/18 12:07 AM  Result Value Ref Range   Lipase 28 11 - 51 U/L   Imaging Studies: No results found.  ED COURSE and MDM  Nursing notes and initial vitals signs, including pulse oximetry, reviewed.  Vitals:   08/30/18 2343 08/30/18 2345  BP:  127/85  Pulse:  (!) 107  Resp:  16  Temp:  98.1 F (36.7 C)  TempSrc:  Oral  SpO2:  100%  Weight: 54 kg   Height: 5\' 6"  (1.676 m)    1:59 AM Partial relief with oral Carafate and IV Zofran.  I suspect her symptoms are due to gastritis as her laboratory studies are normal.  We will start her on a PPI and Carafate.  We  will also replete her potassium.  She has not been vomiting and is not on any potassium depleting medications.  PROCEDURES    ED DIAGNOSES     ICD-10-CM   1. Epigastric pain R10.13   2. Hypokalemia E87.6        Starla Deller, Jonny Ruiz, MD 08/31/18 0201

## 2018-08-31 LAB — URINALYSIS, ROUTINE W REFLEX MICROSCOPIC
Bilirubin Urine: NEGATIVE
Glucose, UA: NEGATIVE mg/dL
Hgb urine dipstick: NEGATIVE
Ketones, ur: 15 mg/dL — AB
Nitrite: NEGATIVE
Protein, ur: NEGATIVE mg/dL
Specific Gravity, Urine: 1.015 (ref 1.005–1.030)
pH: 6.5 (ref 5.0–8.0)

## 2018-08-31 LAB — CBC WITH DIFFERENTIAL/PLATELET
Abs Immature Granulocytes: 0.03 10*3/uL (ref 0.00–0.07)
Basophils Absolute: 0 10*3/uL (ref 0.0–0.1)
Basophils Relative: 0 %
Eosinophils Absolute: 0.1 10*3/uL (ref 0.0–0.5)
Eosinophils Relative: 1 %
HCT: 39.2 % (ref 36.0–46.0)
Hemoglobin: 12.7 g/dL (ref 12.0–15.0)
Immature Granulocytes: 0 %
Lymphocytes Relative: 41 %
Lymphs Abs: 3 10*3/uL (ref 0.7–4.0)
MCH: 27.7 pg (ref 26.0–34.0)
MCHC: 32.4 g/dL (ref 30.0–36.0)
MCV: 85.6 fL (ref 80.0–100.0)
Monocytes Absolute: 0.7 10*3/uL (ref 0.1–1.0)
Monocytes Relative: 10 %
Neutro Abs: 3.5 10*3/uL (ref 1.7–7.7)
Neutrophils Relative %: 48 %
Platelets: 261 10*3/uL (ref 150–400)
RBC: 4.58 MIL/uL (ref 3.87–5.11)
RDW: 14 % (ref 11.5–15.5)
WBC: 7.2 10*3/uL (ref 4.0–10.5)
nRBC: 0 % (ref 0.0–0.2)

## 2018-08-31 LAB — COMPREHENSIVE METABOLIC PANEL
ALT: 17 U/L (ref 0–44)
AST: 21 U/L (ref 15–41)
Albumin: 4.8 g/dL (ref 3.5–5.0)
Alkaline Phosphatase: 41 U/L (ref 38–126)
Anion gap: 10 (ref 5–15)
BUN: 9 mg/dL (ref 6–20)
CO2: 25 mmol/L (ref 22–32)
Calcium: 9.2 mg/dL (ref 8.9–10.3)
Chloride: 100 mmol/L (ref 98–111)
Creatinine, Ser: 0.78 mg/dL (ref 0.44–1.00)
GFR calc Af Amer: >60 mL/min (ref >60)
GFR calc non Af Amer: >60 mL/min (ref >60)
Glucose, Bld: 90 mg/dL (ref 70–99)
Potassium: 2.9 mmol/L — ABNORMAL LOW (ref 3.5–5.1)
Sodium: 135 mmol/L (ref 135–145)
Total Bilirubin: 1.2 mg/dL (ref 0.3–1.2)
Total Protein: 8.4 g/dL — ABNORMAL HIGH (ref 6.5–8.1)

## 2018-08-31 LAB — URINALYSIS, MICROSCOPIC (REFLEX)

## 2018-08-31 LAB — LIPASE, BLOOD: Lipase: 28 U/L (ref 11–51)

## 2018-08-31 MED ORDER — SUCRALFATE 1 G PO TABS: 1.0000 g | ORAL_TABLET | Freq: Three times a day (TID) | ORAL | 0 refills | Status: DC

## 2018-08-31 MED ORDER — POTASSIUM CHLORIDE 20 MEQ/15ML (10%) PO SOLN
20.0000 meq | Freq: Once | ORAL | Status: AC
  Administered 2018-08-31: 01:00:00 20 meq via ORAL
  Filled 2018-08-31: qty 15

## 2018-08-31 MED ORDER — POTASSIUM CHLORIDE CRYS ER 20 MEQ PO TBCR: 20.0000 meq | EXTENDED_RELEASE_TABLET | Freq: Every day | ORAL | 0 refills | Status: DC

## 2018-08-31 MED ORDER — POTASSIUM CHLORIDE CRYS ER 20 MEQ PO TBCR
40.0000 meq | EXTENDED_RELEASE_TABLET | Freq: Once | ORAL | Status: AC
  Administered 2018-08-31: 40 meq via ORAL
  Filled 2018-08-31: qty 2

## 2018-08-31 MED ORDER — OMEPRAZOLE 20 MG PO CPDR: DELAYED_RELEASE_CAPSULE | ORAL | 0 refills | Status: DC

## 2018-08-31 NOTE — ED Notes (Signed)
Pt understood dc material. NAD noted. Scripts given at dc. All questions answered to satisfaction. Pt escorted to check out window 

## 2018-09-01 LAB — URINE CULTURE: Culture: NO GROWTH

## 2018-11-13 ENCOUNTER — Other Ambulatory Visit: Payer: Self-pay

## 2018-11-13 ENCOUNTER — Emergency Department (HOSPITAL_BASED_OUTPATIENT_CLINIC_OR_DEPARTMENT_OTHER): Payer: Medicaid Other

## 2018-11-13 ENCOUNTER — Encounter (HOSPITAL_BASED_OUTPATIENT_CLINIC_OR_DEPARTMENT_OTHER): Payer: Self-pay

## 2018-11-13 ENCOUNTER — Emergency Department (HOSPITAL_BASED_OUTPATIENT_CLINIC_OR_DEPARTMENT_OTHER)
Admission: EM | Admit: 2018-11-13 | Discharge: 2018-11-13 | Disposition: A | Discharge status: Home / Self Care | Payer: Medicaid Other | Attending: Emergency Medicine | Admitting: Emergency Medicine

## 2018-11-13 DIAGNOSIS — W1839XA Other fall on same level, initial encounter: Secondary | ICD-10-CM | POA: Insufficient documentation

## 2018-11-13 DIAGNOSIS — S93402A Sprain of unspecified ligament of left ankle, initial encounter: Secondary | ICD-10-CM | POA: Insufficient documentation

## 2018-11-13 DIAGNOSIS — R55 Syncope and collapse: Secondary | ICD-10-CM | POA: Diagnosis not present

## 2018-11-13 DIAGNOSIS — Y9289 Other specified places as the place of occurrence of the external cause: Secondary | ICD-10-CM | POA: Diagnosis not present

## 2018-11-13 DIAGNOSIS — Y999 Unspecified external cause status: Secondary | ICD-10-CM | POA: Diagnosis not present

## 2018-11-13 DIAGNOSIS — Y9301 Activity, walking, marching and hiking: Secondary | ICD-10-CM | POA: Insufficient documentation

## 2018-11-13 DIAGNOSIS — Z79899 Other long term (current) drug therapy: Secondary | ICD-10-CM | POA: Insufficient documentation

## 2018-11-13 DIAGNOSIS — S99912A Unspecified injury of left ankle, initial encounter: Secondary | ICD-10-CM | POA: Diagnosis present

## 2018-11-13 DIAGNOSIS — F17200 Nicotine dependence, unspecified, uncomplicated: Secondary | ICD-10-CM | POA: Diagnosis not present

## 2018-11-13 LAB — BASIC METABOLIC PANEL
Anion gap: 9 (ref 5–15)
BUN: 6 mg/dL (ref 6–20)
CO2: 22 mmol/L (ref 22–32)
Calcium: 9 mg/dL (ref 8.9–10.3)
Chloride: 105 mmol/L (ref 98–111)
Creatinine, Ser: 0.65 mg/dL (ref 0.44–1.00)
GFR calc Af Amer: >60 mL/min (ref >60)
GFR calc non Af Amer: >60 mL/min (ref >60)
Glucose, Bld: 114 mg/dL — ABNORMAL HIGH (ref 70–99)
Potassium: 3.4 mmol/L — ABNORMAL LOW (ref 3.5–5.1)
Sodium: 136 mmol/L (ref 135–145)

## 2018-11-13 LAB — CBC WITH DIFFERENTIAL/PLATELET
Abs Immature Granulocytes: 0.01 10*3/uL (ref 0.00–0.07)
Basophils Absolute: 0 10*3/uL (ref 0.0–0.1)
Basophils Relative: 0 %
Eosinophils Absolute: 0.1 10*3/uL (ref 0.0–0.5)
Eosinophils Relative: 1 %
HCT: 37.3 % (ref 36.0–46.0)
Hemoglobin: 12.2 g/dL (ref 12.0–15.0)
Immature Granulocytes: 0 %
Lymphocytes Relative: 44 %
Lymphs Abs: 2.8 10*3/uL (ref 0.7–4.0)
MCH: 28.2 pg (ref 26.0–34.0)
MCHC: 32.7 g/dL (ref 30.0–36.0)
MCV: 86.1 fL (ref 80.0–100.0)
Monocytes Absolute: 0.6 10*3/uL (ref 0.1–1.0)
Monocytes Relative: 9 %
Neutro Abs: 2.9 10*3/uL (ref 1.7–7.7)
Neutrophils Relative %: 46 %
Platelets: 241 10*3/uL (ref 150–400)
RBC: 4.33 MIL/uL (ref 3.87–5.11)
RDW: 13.9 % (ref 11.5–15.5)
WBC: 6.4 10*3/uL (ref 4.0–10.5)
nRBC: 0 % (ref 0.0–0.2)

## 2018-11-13 LAB — HCG, QUANTITATIVE, PREGNANCY: hCG, Beta Chain, Quant, S: <1 m[IU]/mL (ref <5)

## 2018-11-13 MED ORDER — ACETAMINOPHEN 500 MG PO TABS
1000.0000 mg | ORAL_TABLET | Freq: Once | ORAL | Status: AC
  Administered 2018-11-13: 1000 mg via ORAL
  Filled 2018-11-13: qty 2

## 2018-11-13 NOTE — ED Triage Notes (Signed)
Pt states she injured left ankle at work 2 days ago when she passed out-NAD-steady limping gait

## 2018-11-13 NOTE — Discharge Instructions (Signed)
You were seen in the ER for passing out episode and left ankle pain.  Blood work is normal.  Blood work for pregnancy is negative.  X-ray of your ankle is negative for any fractures, dislocations.  Because of your passing out episode is unclear but unlikely to be related to your heart or electrolyte abnormalities or low hemoglobin.  Stay well-hydrated.  I suspect you have sprained a ligament in your ankle.  Wear your ASO brace for the next 3 to 5 days to help with compression, stability.  Use your crutches over the next 2 to 3 days to avoid further irritation, weightbearing.  Alternate ibuprofen and Tylenol every 6 hours as needed for pain.  Elevate your foot.  Ice.  Over the next 5 to 7 days as you start to feel better start doing light range of motion exercises like spelling the alphabet with your foot.  Once you start to feel better you may slowly wean off of the brace.  Return to the ER if you have recurrence of passing out, chest pain, shortness of breath, palpitations

## 2018-11-13 NOTE — ED Notes (Signed)
Pt in xray

## 2018-11-13 NOTE — ED Provider Notes (Signed)
[facility] EMERGENCY DEPARTMENT Provider Note   CSN: [id number] Arrival date & time: 11/13/18  2006    History   Chief Complaint Chief Complaint  Patient presents with  . Ankle Injury    HPI Janet Walton is a 28 y.o. female presents to the ER for evaluation of syncope and left ankle pain.  States on Monday she had sudden onset of lightheadedness while at work associated with sweats.  She walked to the bathroom.  As she was closing the door her hands and arms felt weak and they slipped off the door and she passed out falling down on the ground.  She remembers hitting her head.  She is not sure if she fully lost consciousness but remembers feeling "dazed".  She was able to stand up and ambulate on her own.  She finished work.  Later that night she was going up her steps at her house noticed left ankle pain.  This has been gradually worsening.  Ankle pain constant, moderate, worse with weightbearing and flexion of the foot.  She took Tylenol which provided only temporary relief of pain.  No history of previous injuries or fractures in the ankle. She denies distal tingling, numbness, calf pain or swelling. Patient states that the last 2 times that she had syncope she was told she was pregnant.  States that first her urine pregnancy test were negative because her pregnancy hormones never get picked up on the urine test and she needs a blood pregnancy test to confirm.  She is concerned about possibility of pregnancy.  She is sexually active with female partner without condom use.  She took a urine pregnancy test 2 days ago that was negative.  She denies any recent vomiting or diarrhea.  She has been well-hydrated.  She denies associated chest pain, shortness of breath, palpitation associated with the syncope.  She denies any cardiac history.  No history of arrhythmias.  She denies any other injuries from the fall.      HPI  Past Medical History:  Diagnosis Date  . STD (sexually  transmitted disease)     Patient Active Problem List   Diagnosis Date Noted  . Tobacco abuse 03/22/2017  . Ectopic pregnancy, tubal 03/22/2017    Past Surgical History:  Procedure Laterality Date  . LAPAROSCOPY Left 03/22/2017   Procedure: LAPAROSCOPY OPERATIVE WITH LEFT SALPINGECTOMY;  Surgeon: [place] BingPickens, Charlie, MD;  Location: [facility];  Service: Gynecology;  Laterality: Left;  . SALPINGOSTOMY Right    [facility], [facility], [facility]. ex-lap (low transverse skin incision)  . TONSILLECTOMY       OB History    Gravida  4   Para  2   Term  2   Preterm      AB  1   Living  2     SAB  1   TAB      Ectopic      Multiple      Live Births  2            Home Medications    Prior to Admission medications   Medication Sig Start Date End Date Taking? Authorizing Provider  omeprazole (PRILOSEC) 20 MG capsule Take 1 tablet daily at least 30 minutes before first dose of Carafate. 08/31/18   Molpus, John, MD  potassium chloride SA (K-DUR) 20 MEQ tablet Take 1 tablet (20 mEq total) by mouth daily. 08/31/18   Molpus, John, MD  sucralfate (CARAFATE) 1 g tablet Take 1 tablet (  1 g total) by mouth 4 (four) times daily -  with meals and at bedtime. 08/31/18   Molpus, John, MD    Family History No family history on file.  Social History Social History   Tobacco Use  . Smoking status: Current Every Day Smoker  . Smokeless tobacco: Never Used  Substance Use Topics  . Alcohol use: No  . Drug use: No     Allergies   Patient has no known allergies.   Review of Systems Review of Systems  Musculoskeletal: Positive for arthralgias and joint swelling.  Neurological: Positive for syncope and light-headedness.  All other systems reviewed and are negative.    Physical Exam Updated Vital Signs BP 109/73 (BP Location: Left Arm)   Pulse 85   Temp 99.1 F (37.3 C) (Oral)   Resp 18   Ht 5\' 5"  (1.651 m)   Wt 52.2 kg   LMP 10/14/2018   SpO2 100%   BMI 19.14 kg/m   Physical  Exam Vitals signs and nursing note reviewed.  Constitutional:      General: She is not in acute distress.    Appearance: She is well-developed.     Comments: NAD.  HENT:     Head: Normocephalic and atraumatic.     Right Ear: External ear normal.     Left Ear: External ear normal.     Nose: Nose normal.  Eyes:     General: No scleral icterus.    Conjunctiva/sclera: Conjunctivae normal.  Neck:     Musculoskeletal: Normal range of motion and neck supple.  Cardiovascular:     Rate and Rhythm: Normal rate and regular rhythm.     Heart sounds: Normal heart sounds. No murmur.     Comments: 1+ DP pulses bilaterally. No LE edema. No calf tenderness.  Pulmonary:     Effort: Pulmonary effort is normal.     Breath sounds: Normal breath sounds. No wheezing.  Musculoskeletal: Normal range of motion.        General: Tenderness present. No deformity.     Comments:  Left ankle: mild tenderness to anterior mid foot, lateral mid foot below lateral malleolus.  No obvious deformity of ankles or feet including ecchymosis, edema, erythema or skin abrasions. Full passive ROM pain with inversion and plantar flexion. No bony tenderness over posterior aspect of lateral and medial malleoli, navicular bone or 5th metatarsal base.  Achilles tendon is non tender. Negative anterior and posterior drawer. Positive Talar tilt. Negative syndesmosis squeeze test. Negative Thompson test.   Skin:    General: Skin is warm and dry.     Capillary Refill: Capillary refill takes less than 2 seconds.  Neurological:     Mental Status: She is alert and oriented to person, place, and time.     Comments: Sensation and strength intact in left foot   Psychiatric:        Behavior: Behavior normal.        Thought Content: Thought content normal.        Judgment: Judgment normal.      ED Treatments / Results  Labs (all labs ordered are listed, but only abnormal results are displayed) Labs Reviewed  BASIC METABOLIC PANEL -  Abnormal; Notable for the following components:      Result Value   Potassium 3.4 (*)    Glucose, Bld 114 (*)    All other components within normal limits  CBC WITH DIFFERENTIAL/PLATELET  HCG, QUANTITATIVE, PREGNANCY    EKG EKG Interpretation  Date/Time:  Wednesday November 13 2018 21:10:01 EDT Ventricular Rate:  92 PR Interval:  140 QRS Duration: 68 QT Interval:  364 QTC Calculation: 450 R Axis:   40 Text Interpretation:  Normal sinus rhythm Biatrial enlargement Cannot rule out Anterior infarct , age undetermined Abnormal ECG No old tracing to compare Confirmed by Meridee ScoreButler, Michael [phone]([id number]) on 11/13/2018 9:25:09 PM   Radiology No results found.  Procedures Procedures (including critical care time)  Medications Ordered in ED Medications  acetaminophen (TYLENOL) tablet 1,000 mg (1,000 mg Oral Given 11/13/18 2114)     Initial Impression / Assessment and Plan / ED Course  I have reviewed the triage vital signs and the nursing notes.  Pertinent labs & imaging results that were available during my care of the patient were reviewed by me and considered in my medical decision making (see chart for details).  Clinical Course as of Nov 12 2217  Wed Nov 13, 2018  [id number] 28 year old female with left ankle injury status post syncopal event.  She is otherwise nontoxic-appearing.  Getting some baseline labs EKG and x-rays.  Anticipate discharge.   [MB]    Clinical Course User Index [MB] Terrilee FilesButler, Michael C, MD   Highest on ddx includes vasovagal vs orthostatic syncope.  Cardiac syncope considered unlikely. She has no known structural heart disease, CAD, exertional syncope, FH of sudden cardiac death, associated CP, Sob, palpitations. Clinically not consistent with seizure. Doubt PE as she has no pleuritic CP, SOB. PERC negative.   ER work up reviewed by me. EKG without arrhythmias, prolonged QTc, ischemia. No anemia. Electrolytes normal. Hcg quantitative ordered by RN, test negative. No  hypotension here.   Left ankle pain likely soft tissue etiology. X-ray/radiologist having technical difficulties and formal results of imaging not posted however note on x-ray shows preliminary results by radiologist as "negative". X-rays reviewed by me and Dr Charm BargesButler as well and unremarkable.  No other physical injuries reported by patient.  Will dc with oral hydration, close monitoring of light headedness, CP, SOB, recurrent syncope which would warrant return to ER.  Also recommended ASO ankle brace, crutches, NSAID, ice, early ROM exercises for ankle. Pt verbalized understanding and in agreement.  Final Clinical Impressions(s) / ED Diagnoses   Final diagnoses:  Sprain of left ankle, unspecified ligament, initial encounter  Syncope, unspecified syncope type    ED Discharge Orders    None       Jerrell MylarGibbons, Lenita Peregrina J, PA-C 11/13/18 2336    Terrilee FilesButler, Michael C, MD 11/14/18 [phone]

## 2018-12-05 ENCOUNTER — Encounter (HOSPITAL_BASED_OUTPATIENT_CLINIC_OR_DEPARTMENT_OTHER): Payer: Self-pay

## 2018-12-05 ENCOUNTER — Emergency Department (HOSPITAL_BASED_OUTPATIENT_CLINIC_OR_DEPARTMENT_OTHER)
Admission: EM | Admit: 2018-12-05 | Discharge: 2018-12-05 | Disposition: A | Discharge status: Home / Self Care | Payer: Medicaid Other | Attending: Emergency Medicine | Admitting: Emergency Medicine

## 2018-12-05 ENCOUNTER — Other Ambulatory Visit: Payer: Self-pay

## 2018-12-05 ENCOUNTER — Emergency Department (HOSPITAL_BASED_OUTPATIENT_CLINIC_OR_DEPARTMENT_OTHER): Payer: Medicaid Other

## 2018-12-05 DIAGNOSIS — F17210 Nicotine dependence, cigarettes, uncomplicated: Secondary | ICD-10-CM | POA: Diagnosis not present

## 2018-12-05 DIAGNOSIS — B9689 Other specified bacterial agents as the cause of diseases classified elsewhere: Secondary | ICD-10-CM

## 2018-12-05 DIAGNOSIS — R1030 Lower abdominal pain, unspecified: Secondary | ICD-10-CM

## 2018-12-05 DIAGNOSIS — Z79899 Other long term (current) drug therapy: Secondary | ICD-10-CM | POA: Insufficient documentation

## 2018-12-05 DIAGNOSIS — N83209 Unspecified ovarian cyst, unspecified side: Secondary | ICD-10-CM

## 2018-12-05 DIAGNOSIS — N760 Acute vaginitis: Secondary | ICD-10-CM | POA: Insufficient documentation

## 2018-12-05 DIAGNOSIS — N83202 Unspecified ovarian cyst, left side: Secondary | ICD-10-CM | POA: Insufficient documentation

## 2018-12-05 LAB — COMPREHENSIVE METABOLIC PANEL
ALT: 19 U/L (ref 0–44)
AST: 17 U/L (ref 15–41)
Albumin: 4.2 g/dL (ref 3.5–5.0)
Alkaline Phosphatase: 33 U/L — ABNORMAL LOW (ref 38–126)
Anion gap: 8 (ref 5–15)
BUN: 8 mg/dL (ref 6–20)
CO2: 24 mmol/L (ref 22–32)
Calcium: 9.3 mg/dL (ref 8.9–10.3)
Chloride: 104 mmol/L (ref 98–111)
Creatinine, Ser: 0.7 mg/dL (ref 0.44–1.00)
GFR calc Af Amer: >60 mL/min (ref >60)
GFR calc non Af Amer: >60 mL/min (ref >60)
Glucose, Bld: 88 mg/dL (ref 70–99)
Potassium: 3.9 mmol/L (ref 3.5–5.1)
Sodium: 136 mmol/L (ref 135–145)
Total Bilirubin: 0.5 mg/dL (ref 0.3–1.2)
Total Protein: 7.4 g/dL (ref 6.5–8.1)

## 2018-12-05 LAB — WET PREP, GENITAL
Trich, Wet Prep: NONE SEEN
Yeast Wet Prep HPF POC: NONE SEEN

## 2018-12-05 LAB — CBC WITH DIFFERENTIAL/PLATELET
Abs Immature Granulocytes: 0.03 10*3/uL (ref 0.00–0.07)
Basophils Absolute: 0 10*3/uL (ref 0.0–0.1)
Basophils Relative: 0 %
Eosinophils Absolute: 0.1 10*3/uL (ref 0.0–0.5)
Eosinophils Relative: 1 %
HCT: 38 % (ref 36.0–46.0)
Hemoglobin: 12.4 g/dL (ref 12.0–15.0)
Immature Granulocytes: 0 %
Lymphocytes Relative: 31 %
Lymphs Abs: 2.6 10*3/uL (ref 0.7–4.0)
MCH: 28.6 pg (ref 26.0–34.0)
MCHC: 32.6 g/dL (ref 30.0–36.0)
MCV: 87.8 fL (ref 80.0–100.0)
Monocytes Absolute: 0.7 10*3/uL (ref 0.1–1.0)
Monocytes Relative: 8 %
Neutro Abs: 5.1 10*3/uL (ref 1.7–7.7)
Neutrophils Relative %: 60 %
Platelets: 234 10*3/uL (ref 150–400)
RBC: 4.33 MIL/uL (ref 3.87–5.11)
RDW: 14 % (ref 11.5–15.5)
WBC: 8.5 10*3/uL (ref 4.0–10.5)
nRBC: 0 % (ref 0.0–0.2)

## 2018-12-05 LAB — URINALYSIS, ROUTINE W REFLEX MICROSCOPIC
Bilirubin Urine: NEGATIVE
Glucose, UA: NEGATIVE mg/dL
Hgb urine dipstick: NEGATIVE
Ketones, ur: NEGATIVE mg/dL
Leukocytes,Ua: NEGATIVE
Nitrite: NEGATIVE
Protein, ur: NEGATIVE mg/dL
Specific Gravity, Urine: 1.015 (ref 1.005–1.030)
pH: 7 (ref 5.0–8.0)

## 2018-12-05 LAB — PREGNANCY, URINE: Preg Test, Ur: NEGATIVE

## 2018-12-05 LAB — LIPASE, BLOOD: Lipase: 26 U/L (ref 11–51)

## 2018-12-05 MED ORDER — METRONIDAZOLE 500 MG PO TABS: 500.0000 mg | ORAL_TABLET | Freq: Two times a day (BID) | ORAL | 0 refills | Status: DC

## 2018-12-05 NOTE — ED Provider Notes (Signed)
[facility] EMERGENCY DEPARTMENT Provider Note   CSN: [id number] Arrival date & time: 12/05/18  1616     History   Chief Complaint Chief Complaint  Patient presents with   Abdominal Pain    HPI Janet Walton is a 28 y.o. female.     Janet Walton is a 28 y.o. female with a history of STDs, ovarian cyst and 2 ectopic pregnancies, who presents to the ED for evaluation of lower abdominal pain.  She reports she has been having intermittent abdominal pains over the past week, but over the past 2 days she has had worsened pain across her lower abdomen that seems to be more constant.  She has had some nausea associated with this but no episodes of vomiting.  She does report a few episodes of loose nonbloody diarrhea.  She denies dysuria or urinary frequency.  No flank pain or hematuria.  She has not had any vaginal discharge or vaginal bleeding.  Does report she is sexually active and does not use birth control, last menstrual period was on 11/13/2018.  She reports that pain sometimes feels like severe menstrual cramps although she should not be having her period at this time.  She has not taken anything to treat her symptoms prior to arrival, no other aggravating or alleviating factors     Past Medical History:  Diagnosis Date   STD (sexually transmitted disease)     Patient Active Problem List   Diagnosis Date Noted   Tobacco abuse 03/22/2017   Ectopic pregnancy, tubal 03/22/2017    Past Surgical History:  Procedure Laterality Date   LAPAROSCOPY Left 03/22/2017   Procedure: LAPAROSCOPY OPERATIVE WITH LEFT SALPINGECTOMY;  Surgeon: [place] BingPickens, Charlie, MD;  Location: [facility];  Service: Gynecology;  Laterality: Left;   SALPINGOSTOMY Right    ex-lap, 2010, at [facility]. ex-lap (low transverse skin incision)   TONSILLECTOMY       OB History    Gravida  4   Para  2   Term  2   Preterm      AB  1   Living  2     SAB  1   TAB      Ectopic      Multiple      Live Births  2            Home Medications    Prior to Admission medications   Medication Sig Start Date End Date Taking? Authorizing Provider  metroNIDAZOLE (FLAGYL) 500 MG tablet Take 1 tablet (500 mg total) by mouth 2 (two) times daily. One po bid x 7 days 12/05/18   Dartha LodgeFord, Greg Cratty N, PA-C  omeprazole (PRILOSEC) 20 MG capsule Take 1 tablet daily at least 30 minutes before first dose of Carafate. 08/31/18   Molpus, John, MD  potassium chloride SA (K-DUR) 20 MEQ tablet Take 1 tablet (20 mEq total) by mouth daily. 08/31/18   Molpus, John, MD  sucralfate (CARAFATE) 1 g tablet Take 1 tablet (1 g total) by mouth 4 (four) times daily -  with meals and at bedtime. 08/31/18   Molpus, Jonny RuizJohn, MD    Family History History reviewed. No pertinent family history.  Social History Social History   Tobacco Use   Smoking status: Current Every Day Smoker   Smokeless tobacco: Never Used  Substance Use Topics   Alcohol use: No   Drug use: No     Allergies   Patient has no known allergies.   Review of Systems  Review of Systems  Constitutional: Negative for chills and fever.  HENT: Negative.   Respiratory: Negative for cough and shortness of breath.   Cardiovascular: Negative for chest pain.  Gastrointestinal: Positive for abdominal pain, diarrhea and nausea. Negative for blood in stool and vomiting.  Genitourinary: Positive for pelvic pain. Negative for dysuria, frequency, vaginal bleeding and vaginal discharge.  Musculoskeletal: Negative for arthralgias and myalgias.  Skin: Negative for color change and rash.  Neurological: Negative for dizziness, syncope and light-headedness.     Physical Exam Updated Vital Signs BP 102/74 (BP Location: Right Arm)    Pulse 85    Temp 98.2 F (36.8 C) (Oral)    Resp 14    Ht 5\' 5"  (1.651 m)    Wt 53.1 kg    SpO2 100%    BMI 19.47 kg/m   Physical Exam Vitals signs and nursing note reviewed.  Constitutional:      General: She is not  in acute distress.    Appearance: She is well-developed and normal weight. She is not diaphoretic.  HENT:     Head: Normocephalic and atraumatic.     Mouth/Throat:     Mouth: Mucous membranes are moist.     Pharynx: Oropharynx is clear.  Eyes:     General:        Right eye: No discharge.        Left eye: No discharge.     Pupils: Pupils are equal, round, and reactive to light.  Neck:     Musculoskeletal: Neck supple.  Cardiovascular:     Rate and Rhythm: Normal rate and regular rhythm.     Heart sounds: Normal heart sounds.  Pulmonary:     Effort: Pulmonary effort is normal. No respiratory distress.     Breath sounds: Normal breath sounds. No wheezing or rales.     Comments: Respirations equal and unlabored, patient able to speak in full sentences, lungs clear to auscultation bilaterally Abdominal:     General: Bowel sounds are normal. There is no distension.     Palpations: Abdomen is soft. There is no mass.     Tenderness: There is abdominal tenderness in the right lower quadrant, suprapubic area and left lower quadrant. There is no right CVA tenderness, left CVA tenderness or guarding.     Comments: Abdomen is soft and nondistended, bowel sounds present, there is tenderness across the lower abdomen without guarding, tenderness does not localize to one side, no CVA tenderness bilaterally.  Genitourinary:    Comments: Chaperone present during pelvic exam. No external genital lesions noted. Speculum exam reveals a small amount of thin gray discharge, no vaginal erythema or lesions, normal cervix. On bimanual exam patient has moderate discomfort throughout the exam with tenderness to palpation over the uterus as well as both adnexa, no palpable masses. Musculoskeletal:        General: No deformity.  Skin:    General: Skin is warm and dry.     Capillary Refill: Capillary refill takes less than 2 seconds.  Neurological:     Mental Status: She is alert.     Coordination:  Coordination normal.     Comments: Speech is clear, able to follow commands Moves extremities without ataxia, coordination intact  Psychiatric:        Mood and Affect: Affect is flat.        Behavior: Behavior normal.      ED Treatments / Results  Labs (all labs ordered are listed, but only abnormal  results are displayed) Labs Reviewed  WET PREP, GENITAL - Abnormal; Notable for the following components:      Result Value   Clue Cells Wet Prep HPF POC PRESENT (*)    WBC, Wet Prep HPF POC MODERATE (*)    All other components within normal limits  COMPREHENSIVE METABOLIC PANEL - Abnormal; Notable for the following components:   Alkaline Phosphatase 33 (*)    All other components within normal limits  PREGNANCY, URINE  URINALYSIS, ROUTINE W REFLEX MICROSCOPIC  LIPASE, BLOOD  CBC WITH DIFFERENTIAL/PLATELET  RPR  HIV ANTIBODY (ROUTINE TESTING W REFLEX)  GC/CHLAMYDIA PROBE AMP ([place]) NOT AT [facility]    EKG None  Radiology US Transvaginal Non-ob  Result Date: 12/05/2018 CLINICAL DATA:  Pelvic pain for the last 17 hours, acutely worsening EXAM: TRANSABDOMINAL AND TRANSVAGINAL ULTRASOUND OF PELVIS DOPPLER ULTRASOUND OF OVARIES TECHNIQUE: Both transabdominal and transvaginal ultrasound examinations of the pelvis were performed. Transabdominal technique was performed for global imaging of the pelvis including uterus, ovaries, adnexal regions, and pelvic cul-de-sac. It was necessary to proceed with endovaginal exam following the transabdominal exam to visualize the uterus, endometrium, and ovaries. Color and duplex Doppler ultrasound was utilized to evaluate blood flow to the ovaries. COMPARISON:  CT abdomen pelvis 07/07/2017 FINDINGS: Uterus Measurements: 9.7 x 5.4 x 5.2 cm = volume: 144 mL. Anteverted but with slight leftward displacement. No visible fibroids or other mass visualized. Endometrium Thickness: 13 mm.  No focal abnormality visualized. Right ovary Measurements: 3.5 x 2 x 2.8  cm = volume: 10.1 mL. Normal appearance/no adnexal mass. Left ovary Measurements: 4.9 x 2.3 x 4.8 cm = volume: 27.3 mL. Within the left ovary is a fairly well-circumscribed 4.2 x 1.8 x 3.2 cm cystic structure with lace-like reticular internal echoes and posterior acoustic enhancement. No internal color flow. Pulsed Doppler evaluation of both ovaries demonstrates normal low-resistance arterial and venous waveforms. Other findings Moderate volume of echogenic free fluid is seen within the posterior cul-de-sac and in the adnexae. IMPRESSION: 4.2 cm cystic structure in the left adnexa compatible with a hemorrhagic cyst. Small to moderate volume of echogenic fluid in the pelvis concerning for hemorrhagic products suggestive of cyst rupture. Electronically Signed   By: Kreg Shropshire M.D.   On: 12/05/2018 20:09   US Pelvis Complete  Result Date: 12/05/2018 CLINICAL DATA:  Pelvic pain for the last 17 hours, acutely worsening EXAM: TRANSABDOMINAL AND TRANSVAGINAL ULTRASOUND OF PELVIS DOPPLER ULTRASOUND OF OVARIES TECHNIQUE: Both transabdominal and transvaginal ultrasound examinations of the pelvis were performed. Transabdominal technique was performed for global imaging of the pelvis including uterus, ovaries, adnexal regions, and pelvic cul-de-sac. It was necessary to proceed with endovaginal exam following the transabdominal exam to visualize the uterus, endometrium, and ovaries. Color and duplex Doppler ultrasound was utilized to evaluate blood flow to the ovaries. COMPARISON:  CT abdomen pelvis 07/07/2017 FINDINGS: Uterus Measurements: 9.7 x 5.4 x 5.2 cm = volume: 144 mL. Anteverted but with slight leftward displacement. No visible fibroids or other mass visualized. Endometrium Thickness: 13 mm.  No focal abnormality visualized. Right ovary Measurements: 3.5 x 2 x 2.8 cm = volume: 10.1 mL. Normal appearance/no adnexal mass. Left ovary Measurements: 4.9 x 2.3 x 4.8 cm = volume: 27.3 mL. Within the left ovary is a  fairly well-circumscribed 4.2 x 1.8 x 3.2 cm cystic structure with lace-like reticular internal echoes and posterior acoustic enhancement. No internal color flow. Pulsed Doppler evaluation of both ovaries demonstrates normal low-resistance arterial and venous waveforms. Other  findings Moderate volume of echogenic free fluid is seen within the posterior cul-de-sac and in the adnexae. IMPRESSION: 4.2 cm cystic structure in the left adnexa compatible with a hemorrhagic cyst. Small to moderate volume of echogenic fluid in the pelvis concerning for hemorrhagic products suggestive of cyst rupture. Electronically Signed   By: Kreg ShropshirePrice  DeHay M.D.   On: 12/05/2018 20:09   [place] Art/ven Flow Abd Pelv Doppler  Result Date: 12/05/2018 CLINICAL DATA:  Pelvic pain for the last 17 hours, acutely worsening EXAM: TRANSABDOMINAL AND TRANSVAGINAL ULTRASOUND OF PELVIS DOPPLER ULTRASOUND OF OVARIES TECHNIQUE: Both transabdominal and transvaginal ultrasound examinations of the pelvis were performed. Transabdominal technique was performed for global imaging of the pelvis including uterus, ovaries, adnexal regions, and pelvic cul-de-sac. It was necessary to proceed with endovaginal exam following the transabdominal exam to visualize the uterus, endometrium, and ovaries. Color and duplex Doppler ultrasound was utilized to evaluate blood flow to the ovaries. COMPARISON:  CT abdomen pelvis 07/07/2017 FINDINGS: Uterus Measurements: 9.7 x 5.4 x 5.2 cm = volume: 144 mL. Anteverted but with slight leftward displacement. No visible fibroids or other mass visualized. Endometrium Thickness: 13 mm.  No focal abnormality visualized. Right ovary Measurements: 3.5 x 2 x 2.8 cm = volume: 10.1 mL. Normal appearance/no adnexal mass. Left ovary Measurements: 4.9 x 2.3 x 4.8 cm = volume: 27.3 mL. Within the left ovary is a fairly well-circumscribed 4.2 x 1.8 x 3.2 cm cystic structure with lace-like reticular internal echoes and posterior acoustic enhancement.  No internal color flow. Pulsed Doppler evaluation of both ovaries demonstrates normal low-resistance arterial and venous waveforms. Other findings Moderate volume of echogenic free fluid is seen within the posterior cul-de-sac and in the adnexae. IMPRESSION: 4.2 cm cystic structure in the left adnexa compatible with a hemorrhagic cyst. Small to moderate volume of echogenic fluid in the pelvis concerning for hemorrhagic products suggestive of cyst rupture. Electronically Signed   By: Kreg ShropshirePrice  DeHay M.D.   On: 12/05/2018 20:09    Procedures Procedures (including critical care time)  Medications Ordered in ED Medications - No data to display   Initial Impression / Assessment and Plan / ED Course  I have reviewed the triage vital signs and the nursing notes.  Pertinent labs & imaging results that were available during my care of the patient were reviewed by me and considered in my medical decision making (see chart for details).  28 year old female presents with lower abdominal pain started intermittently but over the past 2 days has become more consistent, some nausea but no vomiting, few loose stools.  No urinary symptoms or vaginal discharge.  Does have a history of multiple ectopic pregnancies and ovarian cyst.  On arrival she has normal vitals.  On exam she has tenderness throughout the lower abdomen without guarding or peritoneal signs.  Pelvic exam real small amount of discharge and pain is more pronounced during pelvic exam, I feel this is more likely of pelvic etiology.  STD testing sent with pelvic exam, will also get abdominal labs and pelvic ultrasound.  Labs show no leukocytosis and normal hemoglobin, no acute electrolyte derangements, normal renal and liver function and normal lipase, urinalysis without signs of infection.  Negative pregnancy.  Wet prep with clue cells and moderate WBCs present.  Patient denies concern for STD, and would not like to be prophylactically treated, but will  treat with Flagyl for BV.  Pelvic ultrasound shows a 4.2 cm cystic structure in the left adnexa compatible with a hemorrhagic cyst, no  evidence of torsion, there is also a small to moderate volume of echogenic fluid in the pelvis which suggests products of a recently ruptured hemorrhagic cyst.  With this small amount of fluid present I suspect patient had worsened pain after recently ruptured cyst and this is causing a little bit of irritation in the pelvis.  Will have patient treat with Tylenol, NSAIDs and warm compresses and will have her follow-up with her PCP for continued evaluation of this cyst.  Discussed specific return precautions.  Flagyl prescription given for BV.  Patient expresses understanding and agreement with plan.  Discharged home in good condition.  Final Clinical Impressions(s) / ED Diagnoses   Final diagnoses:  Hemorrhagic ovarian cyst  Lower abdominal pain  BV (bacterial vaginosis)    ED Discharge Orders         Ordered    metroNIDAZOLE (FLAGYL) 500 MG tablet  2 times daily     12/05/18 2106           Dartha Lodge, PA-C 12/06/18 1236    Alvira Monday, MD 12/07/18 1529

## 2018-12-05 NOTE — Discharge Instructions (Signed)
Your ultrasound shows that you have a left-sided ovarian cyst and evidence of another recently ruptured cyst that is likely the cause of your pain.  Your lab work is otherwise been reassuring.  Your wet prep did show bacterial vaginosis, please take Flagyl for the next week as directed, do not drink alcohol while taking this medication as it causes severe nausea and vomiting.  You have STD testing pending and will be called with any positive results if so you will need to go to the health department or your OB/GYN for treatment and notify any partners so they can be tested and treated as well.  Please follow-up with your OB/GYN regarding ovarian cyst.  Return to the ED for new or worsening abdominal pain, fevers, vomiting or any other new or concerning symptoms.

## 2018-12-06 LAB — HIV ANTIBODY (ROUTINE TESTING W REFLEX): HIV Screen 4th Generation wRfx: NONREACTIVE

## 2018-12-06 LAB — RPR: RPR Ser Ql: NONREACTIVE

## 2018-12-07 LAB — GC/CHLAMYDIA PROBE AMP (~~LOC~~) NOT AT ARMC
Chlamydia: NEGATIVE
Neisseria Gonorrhea: NEGATIVE

## 2018-12-10 ENCOUNTER — Encounter (HOSPITAL_BASED_OUTPATIENT_CLINIC_OR_DEPARTMENT_OTHER): Payer: Self-pay | Admitting: Emergency Medicine

## 2018-12-10 ENCOUNTER — Emergency Department (HOSPITAL_BASED_OUTPATIENT_CLINIC_OR_DEPARTMENT_OTHER): Payer: Medicaid Other

## 2018-12-10 ENCOUNTER — Other Ambulatory Visit: Payer: Self-pay | Admitting: Emergency Medicine

## 2018-12-10 ENCOUNTER — Other Ambulatory Visit: Payer: Self-pay

## 2018-12-10 ENCOUNTER — Emergency Department (HOSPITAL_BASED_OUTPATIENT_CLINIC_OR_DEPARTMENT_OTHER)
Admission: EM | Admit: 2018-12-10 | Discharge: 2018-12-10 | Disposition: A | Discharge status: Home / Self Care | Payer: Medicaid Other | Attending: Emergency Medicine | Admitting: Emergency Medicine

## 2018-12-10 DIAGNOSIS — N739 Female pelvic inflammatory disease, unspecified: Secondary | ICD-10-CM | POA: Insufficient documentation

## 2018-12-10 DIAGNOSIS — N83292 Other ovarian cyst, left side: Secondary | ICD-10-CM | POA: Diagnosis not present

## 2018-12-10 DIAGNOSIS — F17200 Nicotine dependence, unspecified, uncomplicated: Secondary | ICD-10-CM | POA: Diagnosis not present

## 2018-12-10 DIAGNOSIS — Z79899 Other long term (current) drug therapy: Secondary | ICD-10-CM | POA: Diagnosis not present

## 2018-12-10 DIAGNOSIS — Z3202 Encounter for pregnancy test, result negative: Secondary | ICD-10-CM | POA: Diagnosis not present

## 2018-12-10 DIAGNOSIS — R1031 Right lower quadrant pain: Secondary | ICD-10-CM

## 2018-12-10 LAB — CBC WITH DIFFERENTIAL/PLATELET
Abs Immature Granulocytes: 0.02 10*3/uL (ref 0.00–0.07)
Basophils Absolute: 0 10*3/uL (ref 0.0–0.1)
Basophils Relative: 0 %
Eosinophils Absolute: 0.1 10*3/uL (ref 0.0–0.5)
Eosinophils Relative: 2 %
HCT: 36.7 % (ref 36.0–46.0)
Hemoglobin: 11.8 g/dL — ABNORMAL LOW (ref 12.0–15.0)
Immature Granulocytes: 0 %
Lymphocytes Relative: 38 %
Lymphs Abs: 1.7 10*3/uL (ref 0.7–4.0)
MCH: 28.2 pg (ref 26.0–34.0)
MCHC: 32.2 g/dL (ref 30.0–36.0)
MCV: 87.8 fL (ref 80.0–100.0)
Monocytes Absolute: 0.6 10*3/uL (ref 0.1–1.0)
Monocytes Relative: 13 %
Neutro Abs: 2.1 10*3/uL (ref 1.7–7.7)
Neutrophils Relative %: 47 %
Platelets: 201 10*3/uL (ref 150–400)
RBC: 4.18 MIL/uL (ref 3.87–5.11)
RDW: 14.1 % (ref 11.5–15.5)
WBC: 4.6 10*3/uL (ref 4.0–10.5)
nRBC: 0 % (ref 0.0–0.2)

## 2018-12-10 LAB — WET PREP, GENITAL
Sperm: NONE SEEN
Trich, Wet Prep: NONE SEEN
Yeast Wet Prep HPF POC: NONE SEEN

## 2018-12-10 LAB — COMPREHENSIVE METABOLIC PANEL
ALT: 16 U/L (ref 0–44)
AST: 17 U/L (ref 15–41)
Albumin: 3.9 g/dL (ref 3.5–5.0)
Alkaline Phosphatase: 34 U/L — ABNORMAL LOW (ref 38–126)
Anion gap: 6 (ref 5–15)
BUN: 8 mg/dL (ref 6–20)
CO2: 27 mmol/L (ref 22–32)
Calcium: 8.7 mg/dL — ABNORMAL LOW (ref 8.9–10.3)
Chloride: 104 mmol/L (ref 98–111)
Creatinine, Ser: 0.63 mg/dL (ref 0.44–1.00)
GFR calc Af Amer: >60 mL/min (ref >60)
GFR calc non Af Amer: >60 mL/min (ref >60)
Glucose, Bld: 91 mg/dL (ref 70–99)
Potassium: 3.2 mmol/L — ABNORMAL LOW (ref 3.5–5.1)
Sodium: 137 mmol/L (ref 135–145)
Total Bilirubin: 0.8 mg/dL (ref 0.3–1.2)
Total Protein: 7.1 g/dL (ref 6.5–8.1)

## 2018-12-10 LAB — URINALYSIS, MICROSCOPIC (REFLEX)

## 2018-12-10 LAB — URINALYSIS, ROUTINE W REFLEX MICROSCOPIC
Bilirubin Urine: NEGATIVE
Glucose, UA: NEGATIVE mg/dL
Ketones, ur: NEGATIVE mg/dL
Nitrite: NEGATIVE
Protein, ur: NEGATIVE mg/dL
Specific Gravity, Urine: 1.015 (ref 1.005–1.030)
pH: 7 (ref 5.0–8.0)

## 2018-12-10 LAB — LIPASE, BLOOD: Lipase: 22 U/L (ref 11–51)

## 2018-12-10 LAB — PREGNANCY, URINE: Preg Test, Ur: NEGATIVE

## 2018-12-10 MED ORDER — CEFTRIAXONE SODIUM 250 MG IJ SOLR
250.0000 mg | Freq: Once | INTRAMUSCULAR | Status: AC
  Administered 2018-12-10: 15:00:00 250 mg via INTRAMUSCULAR
  Filled 2018-12-10: qty 250

## 2018-12-10 MED ORDER — SODIUM CHLORIDE 0.9 % IV BOLUS
1000.0000 mL | Freq: Once | INTRAVENOUS | Status: AC
  Administered 2018-12-10: 12:00:00 1000 mL via INTRAVENOUS

## 2018-12-10 MED ORDER — MORPHINE SULFATE (PF) 4 MG/ML IV SOLN
4.0000 mg | Freq: Once | INTRAVENOUS | Status: AC
  Administered 2018-12-10: 12:00:00 4 mg via INTRAVENOUS
  Filled 2018-12-10: qty 1

## 2018-12-10 MED ORDER — MORPHINE SULFATE (PF) 4 MG/ML IV SOLN
4.0000 mg | Freq: Once | INTRAVENOUS | Status: AC
  Administered 2018-12-10: 14:00:00 4 mg via INTRAVENOUS
  Filled 2018-12-10: qty 1

## 2018-12-10 MED ORDER — POTASSIUM CHLORIDE CRYS ER 20 MEQ PO TBCR
40.0000 meq | EXTENDED_RELEASE_TABLET | Freq: Once | ORAL | Status: AC
  Administered 2018-12-10: 15:00:00 40 meq via ORAL
  Filled 2018-12-10: qty 2

## 2018-12-10 MED ORDER — LIDOCAINE HCL (PF) 1 % IJ SOLN
INTRAMUSCULAR | Status: AC
  Administered 2018-12-10: 15:00:00 1 mL
  Filled 2018-12-10: qty 5

## 2018-12-10 MED ORDER — HYDROCODONE-ACETAMINOPHEN 5-325 MG PO TABS: 1.0000 | ORAL_TABLET | ORAL | 0 refills | Status: DC | PRN

## 2018-12-10 MED ORDER — IOHEXOL 300 MG/ML  SOLN
100.0000 mL | Freq: Once | INTRAMUSCULAR | Status: AC | PRN
  Administered 2018-12-10: 14:00:00 100 mL via INTRAVENOUS

## 2018-12-10 MED ORDER — KETOROLAC TROMETHAMINE 15 MG/ML IJ SOLN
15.0000 mg | Freq: Once | INTRAMUSCULAR | Status: AC
  Administered 2018-12-10: 13:00:00 15 mg via INTRAVENOUS
  Filled 2018-12-10: qty 1

## 2018-12-10 MED ORDER — DOXYCYCLINE HYCLATE 100 MG PO CAPS: 100.0000 mg | ORAL_CAPSULE | Freq: Two times a day (BID) | ORAL | 0 refills | Status: AC

## 2018-12-10 MED ORDER — DOXYCYCLINE HYCLATE 100 MG PO TABS
100.0000 mg | ORAL_TABLET | Freq: Once | ORAL | Status: AC
  Administered 2018-12-10: 15:00:00 100 mg via ORAL
  Filled 2018-12-10: qty 1

## 2018-12-10 MED ORDER — IBUPROFEN 400 MG PO TABS: 400.0000 mg | ORAL_TABLET | Freq: Three times a day (TID) | ORAL | 0 refills | Status: DC | PRN

## 2018-12-10 MED ORDER — CYCLOBENZAPRINE HCL 10 MG PO TABS: 10.0000 mg | ORAL_TABLET | Freq: Three times a day (TID) | ORAL | 0 refills | Status: DC | PRN

## 2018-12-10 MED FILL — IBUPROFEN 400 MG TABS: 400 | 10 days supply | Qty: 30 | Fill #0

## 2018-12-10 MED FILL — CYCLOBENZAPRINE HCL 10 MG T: 10 | 5 days supply | Qty: 15 | Fill #0

## 2018-12-10 MED FILL — HYDROCODON-APAP 5-325: 5-325 | 3 days supply | Qty: 15 | Fill #0

## 2018-12-10 MED FILL — DOXYCYCLINE HYCLATE 100 MG: 100 | 14 days supply | Qty: 27 | Fill #0

## 2018-12-10 NOTE — ED Provider Notes (Signed)
[facility] EMERGENCY DEPARTMENT Provider Note   CSN: [id number] Arrival date & time: 12/10/18  1049     History   Chief Complaint Chief Complaint  Patient presents with  . Abdominal Pain    HPI Janet Walton is a 28 y.o. female.     HPI  28 year old female presents with vaginal bleeding and right lower quadrant abdominal pain.  She states that this pain is similar to when she was here on 9/10.  He got a little bit better after she was diagnosed with a hemorrhagic cyst but never went away.  Seems to be worse since yesterday.  No nausea, vomiting, fever, diarrhea or urinary symptoms.  She is currently taking the Flagyl and denies any discharge.  Started bleeding which she states is early for her next menstrual cycle.  Has been taking ibuprofen with no relief. The pain runs up her abdomen towards her chest and down her right leg.   Past Medical History:  Diagnosis Date  . STD (sexually transmitted disease)     Patient Active Problem List   Diagnosis Date Noted  . Tobacco abuse 03/22/2017  . Ectopic pregnancy, tubal 03/22/2017    Past Surgical History:  Procedure Laterality Date  . LAPAROSCOPY Left 03/22/2017   Procedure: LAPAROSCOPY OPERATIVE WITH LEFT SALPINGECTOMY;  Surgeon: Aletha Halim, MD;  Location: [facility];  Service: Gynecology;  Laterality: Left;  . SALPINGOSTOMY Right    ex-lap, 2010, at [facility] ex-lap (low transverse skin incision)  . TONSILLECTOMY       OB History    Gravida  4   Para  2   Term  2   Preterm      AB  1   Living  2     SAB  1   TAB      Ectopic      Multiple      Live Births  2            Home Medications    Prior to Admission medications   Medication Sig Start Date End Date Taking? Authorizing Provider  metroNIDAZOLE (FLAGYL) 500 MG tablet Take 1 tablet (500 mg total) by mouth 2 (two) times daily. One po bid x 7 days 12/05/18  Yes Jacqlyn Larsen, PA-C  doxycycline (VIBRAMYCIN) 100 MG capsule Take  1 capsule (100 mg total) by mouth 2 (two) times daily for 14 days. 12/10/18 12/24/18  Sherwood Gambler, MD  HYDROcodone-acetaminophen (NORCO) 5-325 MG tablet Take 1 tablet by mouth every 4 (four) hours as needed for severe pain. 12/10/18   Sherwood Gambler, MD  ibuprofen (ADVIL) 400 MG tablet Take 1 tablet (400 mg total) by mouth every 8 (eight) hours as needed. 12/10/18   Sherwood Gambler, MD  omeprazole (PRILOSEC) 20 MG capsule Take 1 tablet daily at least 30 minutes before first dose of Carafate. 08/31/18   Molpus, John, MD  potassium chloride SA (K-DUR) 20 MEQ tablet Take 1 tablet (20 mEq total) by mouth daily. 08/31/18   Molpus, John, MD  sucralfate (CARAFATE) 1 g tablet Take 1 tablet (1 g total) by mouth 4 (four) times daily -  with meals and at bedtime. 08/31/18   Molpus, John, MD    Family History No family history on file.  Social History Social History   Tobacco Use  . Smoking status: Current Every Day Smoker  . Smokeless tobacco: Never Used  Substance Use Topics  . Alcohol use: No  . Drug use: No  Allergies   Patient has no known allergies.   Review of Systems Review of Systems  Constitutional: Negative for fever.  Gastrointestinal: Positive for abdominal pain. Negative for diarrhea and vomiting.  Genitourinary: Positive for vaginal bleeding. Negative for dysuria and vaginal discharge.  Musculoskeletal: Negative for back pain.  All other systems reviewed and are negative.    Physical Exam Updated Vital Signs BP 103/79 (BP Location: Right Arm)   Pulse 63   Temp 97.6 F (36.4 C) (Oral)   Resp 14   Ht 5\' 6"  (1.676 m)   Wt 52.6 kg   LMP 11/13/2018   SpO2 100%   BMI 18.72 kg/m   Physical Exam Vitals signs and nursing note reviewed.  Constitutional:      Appearance: She is well-developed.  HENT:     Head: Normocephalic and atraumatic.     Right Ear: External ear normal.     Left Ear: External ear normal.     Nose: Nose normal.  Eyes:     General:        Right  eye: No discharge.        Left eye: No discharge.  Cardiovascular:     Rate and Rhythm: Normal rate and regular rhythm.     Heart sounds: Normal heart sounds.  Pulmonary:     Effort: Pulmonary effort is normal.     Breath sounds: Normal breath sounds.  Abdominal:     Palpations: Abdomen is soft.     Tenderness: There is abdominal tenderness in the right lower quadrant. There is no right CVA tenderness or left CVA tenderness.  Genitourinary:    Vagina: Bleeding present.     Cervix: No cervical motion tenderness.     Adnexa:        Right: No mass.         Left: No mass.    Skin:    General: Skin is warm and dry.  Neurological:     Mental Status: She is alert.  Psychiatric:        Mood and Affect: Mood is not anxious.      ED Treatments / Results  Labs (all labs ordered are listed, but only abnormal results are displayed) Labs Reviewed  WET PREP, GENITAL - Abnormal; Notable for the following components:      Result Value   Clue Cells Wet Prep HPF POC PRESENT (*)    WBC, Wet Prep HPF POC PRESENT (*)    All other components within normal limits  URINALYSIS, ROUTINE W REFLEX MICROSCOPIC - Abnormal; Notable for the following components:   Hgb urine dipstick LARGE (*)    Leukocytes,Ua SMALL (*)    All other components within normal limits  COMPREHENSIVE METABOLIC PANEL - Abnormal; Notable for the following components:   Potassium 3.2 (*)    Calcium 8.7 (*)    Alkaline Phosphatase 34 (*)    All other components within normal limits  CBC WITH DIFFERENTIAL/PLATELET - Abnormal; Notable for the following components:   Hemoglobin 11.8 (*)    All other components within normal limits  URINALYSIS, MICROSCOPIC (REFLEX) - Abnormal; Notable for the following components:   Bacteria, UA RARE (*)    All other components within normal limits  PREGNANCY, URINE  LIPASE, BLOOD  GC/CHLAMYDIA PROBE AMP ([place]) NOT [facility]    EKG None  Radiology [place] Pelvis Transvaginal Non-ob (tv  Only)  Result Date: 12/10/2018 CLINICAL DATA:  Right upper quadrant pain. Prior left salpingectomy. EXAM: TRANSABDOMINAL AND  TRANSVAGINAL ULTRASOUND OF PELVIS DOPPLER ULTRASOUND OF OVARIES TECHNIQUE: Both transabdominal and transvaginal ultrasound examinations of the pelvis were performed. Transabdominal technique was performed for global imaging of the pelvis including uterus, ovaries, adnexal regions, and pelvic cul-de-sac. It was necessary to proceed with endovaginal exam following the transabdominal exam to visualize the uterus and ovaries. Color and duplex Doppler ultrasound was utilized to evaluate blood flow to the ovaries. COMPARISON:  12/05/2018. FINDINGS: Uterus Measurements: 9.4 x 4.9 x 5.2 cm = volume: 125.7 mL. No fibroids or other mass visualized. Endometrium Thickness: 3.2.  No focal abnormality visualized. Right ovary Measurements: 3.8 x 2.0 x 2.9 cm = volume: 11.5 mL. Normal appearance/no adnexal mass. Left ovary Measurements: 4.2 x 2.7 x 3.8 cm = volume: 21.8 mL. 2.7 x 1.5 x 2.7 cm complex cyst most consistent with hemorrhagic cyst. This is smaller than on prior exam and continued follow-up exams demonstrate resolution can be obtained. Pulsed Doppler evaluation of both ovaries demonstrates normal low-resistance arterial and venous waveforms. Other findings Moderate amount of free pelvic fluid. IMPRESSION: 1. 2.7 x 1.5 x 2.7 cm complex cyst left ovary, smaller than on prior exam. This is most likely a resolving hemorrhagic cyst. Continued follow-up exams to demonstrate complete resolution can be obtained. 2.  No evidence of ovarian torsion. 3.  Moderate amount of free pelvic fluid. Electronically Signed   By: Maisie Fus  Register   On: 12/10/2018 13:57   Ct Abdomen Pelvis W Contrast  Result Date: 12/10/2018 CLINICAL DATA:  28 year old presenting with acute onset of RIGHT LOWER QUADRANT abdominal pain that began yesterday, associated with vaginal bleeding. Surgical history includes LEFT  salpingectomy. EXAM: CT ABDOMEN AND PELVIS WITH CONTRAST TECHNIQUE: Multidetector CT imaging of the abdomen and pelvis was performed using the standard protocol following bolus administration of intravenous contrast. CONTRAST:  OMNIPAQUE IOHEXOL 300 MG/ML IV. COMPARISON:  07/07/2017, 12/05/2016. FINDINGS: Lower chest: Heart size normal.  Visualized lung bases clear. Hepatobiliary: Diffuse hepatic steatosis without focal hepatic parenchymal abnormality. Anatomic variant in that the LEFT lobe of the liver extends well across the midline into the LEFT UPPER QUADRANT. Gallbladder normal in appearance without calcified gallstones. No biliary ductal dilation. Pancreas: Normal in appearance without evidence of mass, ductal dilation, or inflammation. Spleen: Normal in size and appearance. Adrenals/Urinary Tract: Normal appearing adrenal glands. Kidneys normal in size and appearance without focal parenchymal abnormality. No hydronephrosis. No evidence of urinary tract calculi. Urinary bladder decompressed and unremarkable. Stomach/Bowel: Stomach normal in appearance for the degree of distention. Normal-appearing small bowel. Normal-appearing colon with expected stool burden. Normal appendix in the RIGHT mid pelvis. Vascular/Lymphatic: No visible aortoiliofemoral atherosclerosis. Widely patent visceral arteries. Normal-appearing portal venous and systemic venous systems with the exception of a nutcracker LEFT renal vein (mild compression between the aorta and SMA). No pathologic lymphadenopathy. Reproductive: Edematous/inflammatory changes throughout the BILATERAL adnexa, with prominent parametrial vessels. Small amount of free fluid in the pelvis. Approximate 2.7 x 1.4 x 2.5 cm LEFT ovarian cyst. No solid adnexal masses. No visible uterine fibroids. Other: None. Musculoskeletal: Regional skeleton unremarkable without acute or significant osseous abnormality. IMPRESSION: 1. Edematous/inflammatory changes throughout the  BILATERAL adnexa, with prominent parametrial vessels, possibly indicating pelvic inflammatory disease. 2. Benign approximate 2.7 cm LEFT ovarian cyst. 3. Diffuse hepatic steatosis without focal hepatic parenchymal abnormality. Electronically Signed   By: Hulan Saas M.D.   On: 12/10/2018 14:50   US Pelvic Doppler (torsion R/o Or Mass Arterial Flow)  Result Date: 12/10/2018 CLINICAL DATA:  Right upper quadrant pain.  Prior left salpingectomy. EXAM: TRANSABDOMINAL AND TRANSVAGINAL ULTRASOUND OF PELVIS DOPPLER ULTRASOUND OF OVARIES TECHNIQUE: Both transabdominal and transvaginal ultrasound examinations of the pelvis were performed. Transabdominal technique was performed for global imaging of the pelvis including uterus, ovaries, adnexal regions, and pelvic cul-de-sac. It was necessary to proceed with endovaginal exam following the transabdominal exam to visualize the uterus and ovaries. Color and duplex Doppler ultrasound was utilized to evaluate blood flow to the ovaries. COMPARISON:  12/05/2018. FINDINGS: Uterus Measurements: 9.4 x 4.9 x 5.2 cm = volume: 125.7 mL. No fibroids or other mass visualized. Endometrium Thickness: 3.2.  No focal abnormality visualized. Right ovary Measurements: 3.8 x 2.0 x 2.9 cm = volume: 11.5 mL. Normal appearance/no adnexal mass. Left ovary Measurements: 4.2 x 2.7 x 3.8 cm = volume: 21.8 mL. 2.7 x 1.5 x 2.7 cm complex cyst most consistent with hemorrhagic cyst. This is smaller than on prior exam and continued follow-up exams demonstrate resolution can be obtained. Pulsed Doppler evaluation of both ovaries demonstrates normal low-resistance arterial and venous waveforms. Other findings Moderate amount of free pelvic fluid. IMPRESSION: 1. 2.7 x 1.5 x 2.7 cm complex cyst left ovary, smaller than on prior exam. This is most likely a resolving hemorrhagic cyst. Continued follow-up exams to demonstrate complete resolution can be obtained. 2.  No evidence of ovarian torsion. 3.   Moderate amount of free pelvic fluid. Electronically Signed   By: Maisie Fus  Register   On: 12/10/2018 13:57    Procedures Procedures (including critical care time)  Medications Ordered in ED Medications  cefTRIAXone (ROCEPHIN) injection 250 mg (has no administration in time range)  doxycycline (VIBRA-TABS) tablet 100 mg (has no administration in time range)  lidocaine (PF) (XYLOCAINE) 1 % injection (has no administration in time range)  sodium chloride 0.9 % bolus 1,000 mL (0 mLs Intravenous Stopped 12/10/18 1245)  morphine 4 MG/ML injection 4 mg (4 mg Intravenous Given 12/10/18 1144)  ketorolac (TORADOL) 15 MG/ML injection 15 mg (15 mg Intravenous Given 12/10/18 1245)  morphine 4 MG/ML injection 4 mg (4 mg Intravenous Given 12/10/18 1412)  iohexol (OMNIPAQUE) 300 MG/ML solution 100 mL (100 mLs Intravenous Contrast Given 12/10/18 1425)  potassium chloride SA (K-DUR) CR tablet 40 mEq (40 mEq Oral Given 12/10/18 1502)     Initial Impression / Assessment and Plan / ED Course  I have reviewed the triage vital signs and the nursing notes.  Pertinent labs & imaging results that were available during my care of the patient were reviewed by me and considered in my medical decision making (see chart for details).        Ultrasound does not show any obvious cause for her pain as the side of the cyst is not the same side as her pain.  Thus CT obtained.  While it does not show appendicitis, it is much more concerning for PID, which I think is what the patient is having.  She is already on Flagyl so I will add IM Rocephin here and discharged with 2 weeks of doxycycline.  Norco for pain.  Continue NSAIDs.  Otherwise, she already has OB/GYN follow-up next week.  Final Clinical Impressions(s) / ED Diagnoses   Final diagnoses:  Pelvic inflammatory disease (PID)    ED Discharge Orders         Ordered    cyclobenzaprine (FLEXERIL) 10 MG tablet  3 times daily PRN,   Status:  Discontinued     12/10/18  1151    HYDROcodone-acetaminophen (NORCO) 5-325 MG tablet  Every 4 hours PRN     12/10/18 1502    doxycycline (VIBRAMYCIN) 100 MG capsule  2 times daily     12/10/18 1502    ibuprofen (ADVIL) 400 MG tablet  Every 8 hours PRN     12/10/18 1502           Pricilla LovelessGoldston, Peggyann Zwiefelhofer, MD 12/10/18 1512

## 2018-12-10 NOTE — ED Notes (Signed)
Pt verbalized understanding of dc instructions.

## 2018-12-10 NOTE — ED Triage Notes (Addendum)
RLQ pain since yesterday with vaginal bleeding. History of ovarian cyst. Also endorses nausea. Currently being treated for BV with flagyl '

## 2018-12-10 NOTE — Discharge Instructions (Addendum)
It is important to take the antibiotics until completed.  Continue the Flagyl.  Follow-up with your OB/GYN next week as already scheduled.  If you develop worsening, continued, or recurrent abdominal pain, uncontrolled vomiting, fever, chest or back pain, or any other new/concerning symptoms then return to the ER for evaluation.

## 2018-12-11 LAB — CERVICOVAGINAL ANCILLARY ONLY
Chlamydia: NEGATIVE
Neisseria Gonorrhea: NEGATIVE

## 2019-09-10 ENCOUNTER — Other Ambulatory Visit: Payer: Self-pay

## 2019-09-10 ENCOUNTER — Encounter (HOSPITAL_BASED_OUTPATIENT_CLINIC_OR_DEPARTMENT_OTHER): Payer: Self-pay | Admitting: Emergency Medicine

## 2019-09-10 ENCOUNTER — Emergency Department (HOSPITAL_BASED_OUTPATIENT_CLINIC_OR_DEPARTMENT_OTHER)
Admission: EM | Admit: 2019-09-10 | Discharge: 2019-09-10 | Disposition: A | Discharge status: Home / Self Care | Payer: Medicaid Other | Attending: Emergency Medicine | Admitting: Emergency Medicine

## 2019-09-10 DIAGNOSIS — B9689 Other specified bacterial agents as the cause of diseases classified elsewhere: Secondary | ICD-10-CM | POA: Insufficient documentation

## 2019-09-10 DIAGNOSIS — M545 Low back pain: Secondary | ICD-10-CM | POA: Insufficient documentation

## 2019-09-10 DIAGNOSIS — R109 Unspecified abdominal pain: Secondary | ICD-10-CM | POA: Insufficient documentation

## 2019-09-10 DIAGNOSIS — F17210 Nicotine dependence, cigarettes, uncomplicated: Secondary | ICD-10-CM | POA: Diagnosis not present

## 2019-09-10 DIAGNOSIS — N390 Urinary tract infection, site not specified: Secondary | ICD-10-CM | POA: Diagnosis not present

## 2019-09-10 DIAGNOSIS — R300 Dysuria: Secondary | ICD-10-CM | POA: Insufficient documentation

## 2019-09-10 DIAGNOSIS — R110 Nausea: Secondary | ICD-10-CM | POA: Diagnosis present

## 2019-09-10 LAB — CBC WITH DIFFERENTIAL/PLATELET
Abs Immature Granulocytes: 0.01 10*3/uL (ref 0.00–0.07)
Basophils Absolute: 0 10*3/uL (ref 0.0–0.1)
Basophils Relative: 0 %
Eosinophils Absolute: 0.1 10*3/uL (ref 0.0–0.5)
Eosinophils Relative: 1 %
HCT: 38.2 % (ref 36.0–46.0)
Hemoglobin: 12.4 g/dL (ref 12.0–15.0)
Immature Granulocytes: 0 %
Lymphocytes Relative: 39 %
Lymphs Abs: 2.4 10*3/uL (ref 0.7–4.0)
MCH: 28.2 pg (ref 26.0–34.0)
MCHC: 32.5 g/dL (ref 30.0–36.0)
MCV: 86.8 fL (ref 80.0–100.0)
Monocytes Absolute: 0.5 10*3/uL (ref 0.1–1.0)
Monocytes Relative: 8 %
Neutro Abs: 3.2 10*3/uL (ref 1.7–7.7)
Neutrophils Relative %: 52 %
Platelets: 263 10*3/uL (ref 150–400)
RBC: 4.4 MIL/uL (ref 3.87–5.11)
RDW: 13.4 % (ref 11.5–15.5)
WBC: 6.2 10*3/uL (ref 4.0–10.5)
nRBC: 0 % (ref 0.0–0.2)

## 2019-09-10 LAB — URINALYSIS, ROUTINE W REFLEX MICROSCOPIC
Bilirubin Urine: NEGATIVE
Glucose, UA: NEGATIVE mg/dL
Hgb urine dipstick: NEGATIVE
Ketones, ur: NEGATIVE mg/dL
Nitrite: POSITIVE — AB
Protein, ur: NEGATIVE mg/dL
Specific Gravity, Urine: 1.015 (ref 1.005–1.030)
pH: 6.5 (ref 5.0–8.0)

## 2019-09-10 LAB — COMPREHENSIVE METABOLIC PANEL
ALT: 20 U/L (ref 0–44)
AST: 17 U/L (ref 15–41)
Albumin: 3.8 g/dL (ref 3.5–5.0)
Alkaline Phosphatase: 40 U/L (ref 38–126)
Anion gap: 8 (ref 5–15)
BUN: 7 mg/dL (ref 6–20)
CO2: 24 mmol/L (ref 22–32)
Calcium: 8.7 mg/dL — ABNORMAL LOW (ref 8.9–10.3)
Chloride: 102 mmol/L (ref 98–111)
Creatinine, Ser: 0.66 mg/dL (ref 0.44–1.00)
GFR calc Af Amer: >60 mL/min (ref >60)
GFR calc non Af Amer: >60 mL/min (ref >60)
Glucose, Bld: 91 mg/dL (ref 70–99)
Potassium: 3.6 mmol/L (ref 3.5–5.1)
Sodium: 134 mmol/L — ABNORMAL LOW (ref 135–145)
Total Bilirubin: 0.7 mg/dL (ref 0.3–1.2)
Total Protein: 7.3 g/dL (ref 6.5–8.1)

## 2019-09-10 LAB — URINALYSIS, MICROSCOPIC (REFLEX)

## 2019-09-10 LAB — PREGNANCY, URINE: Preg Test, Ur: NEGATIVE

## 2019-09-10 LAB — HCG, SERUM, QUALITATIVE: Preg, Serum: NEGATIVE

## 2019-09-10 LAB — LIPASE, BLOOD: Lipase: 18 U/L (ref 11–51)

## 2019-09-10 MED ORDER — CEPHALEXIN 500 MG PO CAPS
500.0000 mg | ORAL_CAPSULE | Freq: Two times a day (BID) | ORAL | 0 refills | Status: AC
Start: 2019-09-10 — End: 2019-09-15

## 2019-09-10 MED ORDER — SODIUM CHLORIDE 0.9 % IV SOLN
1.0000 g | Freq: Once | INTRAVENOUS | Status: AC
  Administered 2019-09-10: 1 g via INTRAVENOUS
  Filled 2019-09-10: qty 10

## 2019-09-10 MED ORDER — SODIUM CHLORIDE 0.9 % IV BOLUS
500.0000 mL | Freq: Once | INTRAVENOUS | Status: AC
  Administered 2019-09-10: 500 mL via INTRAVENOUS

## 2019-09-10 NOTE — ED Notes (Signed)
abd and back pain off and on x 5 weeks

## 2019-09-10 NOTE — Discharge Instructions (Addendum)
At this time there does not appear to be the presence of an emergent medical condition, however there is always the potential for conditions to change. Please read and follow the below instructions.  Please return to the Emergency Department immediately for any new or worsening symptoms or if your symptoms do not improve within 3 days. Please be sure to follow up with your Primary Care Provider within one week regarding your visit today; please call their office to schedule an appointment even if you are feeling better for a follow-up visit. Please take your antibiotic Keflex as prescribed until complete to help with your symptoms.  Please drink enough water to avoid dehydration and get plenty of rest.  Get help right away if: You have very bad back pain. You have very bad pain in your lower belly. You have a fever. You are sick to your stomach (nauseous). You are throwing up. You have any new/concerning or worsening of symptoms  Please read the additional information packets attached to your discharge summary.  Do not take your medicine if  develop an itchy rash, swelling in your mouth or lips, or difficulty breathing; call 911 and seek immediate emergency medical attention if this occurs.  Note: Portions of this text may have been transcribed using voice recognition software. Every effort was made to ensure accuracy; however, inadvertent computerized transcription errors may still be present.

## 2019-09-10 NOTE — ED Provider Notes (Signed)
[facility] EMERGENCY DEPARTMENT Provider Note   CSN: [id number] Arrival date & time: 09/10/19  1255     History Chief Complaint  Patient presents with  . Nausea  . Abdominal Pain  . Back Pain    Janet Walton is a 29 y.o. female presents today for nausea, abdominal pain and lower back pain x5 weeks.  Symptoms gradual in onset gradually worsening.  Describes upper/mid abdominal discomfort as a mild intermittent ache nonradiating no clear aggravating or alleviating factors.  Reports bilateral low back pain as a mild intermittent ache nonradiating no clear alleviating factors.  Reports nausea without vomiting.  Associated with dysuria without hematuria.  Patient denies fall/injury, fever/chills, headache, sore throat, chest pain/shortness of breath, vomiting, diarrhea, vaginal bleeding/discharge, concern for STI or any additional concerns. HPI     Past Medical History:  Diagnosis Date  . STD (sexually transmitted disease)     Patient Active Problem List   Diagnosis Date Noted  . Tobacco abuse 03/22/2017  . Ectopic pregnancy, tubal 03/22/2017    Past Surgical History:  Procedure Laterality Date  . LAPAROSCOPY Left 03/22/2017   Procedure: LAPAROSCOPY OPERATIVE WITH LEFT SALPINGECTOMY;  Surgeon: Moody Bing, MD;  Location: [facility];  Service: Gynecology;  Laterality: Left;  . SALPINGOSTOMY Right    ex-lap, 2010, at [facility]. ex-lap (low transverse skin incision)  . TONSILLECTOMY       OB History    Gravida  4   Para  2   Term  2   Preterm      AB  1   Living  2     SAB  1   TAB      Ectopic      Multiple      Live Births  2           History reviewed. No pertinent family history.  Social History   Tobacco Use  . Smoking status: Current Every Day Smoker  . Smokeless tobacco: Never Used  Vaping Use  . Vaping Use: Never used  Substance Use Topics  . Alcohol use: No  . Drug use: No    Home Medications Prior to Admission  medications   Medication Sig Start Date End Date Taking? Authorizing Provider  cephALEXin (KEFLEX) 500 MG capsule Take 1 capsule (500 mg total) by mouth 2 (two) times daily for 5 days. 09/10/19 09/15/19  Bill Salinas, PA-C  HYDROcodone-acetaminophen (NORCO) 5-325 MG tablet Take 1 tablet by mouth every 4 (four) hours as needed for severe pain. 12/10/18   Pricilla Loveless, MD  ibuprofen (ADVIL) 400 MG tablet Take 1 tablet (400 mg total) by mouth every 8 (eight) hours as needed. 12/10/18   Pricilla Loveless, MD  metroNIDAZOLE (FLAGYL) 500 MG tablet Take 1 tablet (500 mg total) by mouth 2 (two) times daily. One po bid x 7 days 12/05/18   Dartha Lodge, PA-C  omeprazole (PRILOSEC) 20 MG capsule Take 1 tablet daily at least 30 minutes before first dose of Carafate. 08/31/18   Molpus, John, MD  potassium chloride SA (K-DUR) 20 MEQ tablet Take 1 tablet (20 mEq total) by mouth daily. 08/31/18   Molpus, John, MD  sucralfate (CARAFATE) 1 g tablet Take 1 tablet (1 g total) by mouth 4 (four) times daily -  with meals and at bedtime. 08/31/18   Molpus, John, MD    Allergies    Patient has no known allergies.  Review of Systems   Review of Systems Ten systems  are reviewed and are negative for acute change except as noted in the HPI  Physical Exam Updated Vital Signs BP 103/68 (BP Location: Left Arm)   Pulse (!) 55   Temp 98.3 F (36.8 C) (Oral)   Resp 16   Ht 5\' 6"  (1.676 m)   Wt 53.8 kg   SpO2 100%   Breastfeeding No   BMI 19.13 kg/m   Physical Exam Constitutional:      General: She is not in acute distress.    Appearance: Normal appearance. She is well-developed. She is not ill-appearing or diaphoretic.  HENT:     Head: Normocephalic and atraumatic.  Eyes:     General: Vision grossly intact. Gaze aligned appropriately.     Pupils: Pupils are equal, round, and reactive to light.  Neck:     Trachea: Trachea and phonation normal.  Pulmonary:     Effort: Pulmonary effort is normal. No respiratory  distress.  Abdominal:     General: There is no distension.     Palpations: Abdomen is soft.     Tenderness: There is no abdominal tenderness. There is no guarding or rebound.  Genitourinary:    Comments: Pelvic examination deferred by patient Musculoskeletal:        General: Normal range of motion.     Cervical back: Normal range of motion.  Skin:    General: Skin is warm and dry.  Neurological:     Mental Status: She is alert.     GCS: GCS eye subscore is 4. GCS verbal subscore is 5. GCS motor subscore is 6.     Comments: Speech is clear and goal oriented, follows commands Major Cranial nerves without deficit, no facial droop Moves extremities without ataxia, coordination intact  Psychiatric:        Behavior: Behavior normal.     ED Results / Procedures / Treatments   Labs (all labs ordered are listed, but only abnormal results are displayed) Labs Reviewed  COMPREHENSIVE METABOLIC PANEL - Abnormal; Notable for the following components:      Result Value   Sodium 134 (*)    Calcium 8.7 (*)    All other components within normal limits  URINALYSIS, ROUTINE W REFLEX MICROSCOPIC - Abnormal; Notable for the following components:   APPearance CLOUDY (*)    Nitrite POSITIVE (*)    Leukocytes,Ua MODERATE (*)    All other components within normal limits  URINALYSIS, MICROSCOPIC (REFLEX) - Abnormal; Notable for the following components:   Bacteria, UA MANY (*)    All other components within normal limits  URINE CULTURE  CBC WITH DIFFERENTIAL/PLATELET  LIPASE, BLOOD  PREGNANCY, URINE  HCG, SERUM, QUALITATIVE    EKG None  Radiology No results found.  Procedures Procedures (including critical care time)  Medications Ordered in ED Medications  sodium chloride 0.9 % bolus 500 mL (500 mLs Intravenous New Bag/Given 09/10/19 1455)  cefTRIAXone (ROCEPHIN) 1 g in sodium chloride 0.9 % 100 mL IVPB (0 g Intravenous Stopped 09/10/19 1530)    ED Course  I have reviewed the triage  vital signs and the nursing notes.  Pertinent labs & imaging results that were available during my care of the patient were reviewed by me and considered in my medical decision making (see chart for details).    MDM Rules/Calculators/A&P                          Additional History Obtained: 1. Nursing notes from  this visit. 2. Prior ED visit on 12/10/2018, patient had a diagnosis of PID. --- 29 year old female presents today with vague abdominal pain and lower back pain for 5 weeks with intermittent dysuria.  On examination she is well-appearing and in no acute distress, no abdominal tenderness on examination.  She has no evidence of injury to the back or abdomen.  She denies any concern for STI or abnormal vaginal discharge/bleeding.  Patient has deferred pelvic exam.  She reports does not feel similar to prior episode of PID.  Plan of care is to obtain abdominal pain labs including CBC, CMP, lipase, urinalysis, pregnancy test and reassess. - I ordered, reviewed and interpreted labs which include: CBC within normal limits, no leukocytosis to suggest infection and no evidence of anemia. CMP shows no emergent electrolyte derangement, evidence of acute kidney injury, elevation of LFTs or anion gap. Pregnancy test negative, doubt ectopic Lipase within normal limits, doubt pancreatitis. Urinalysis is significant for nitrates, leukocytes, bacteria and WBCs suggestive of urinary tract infection.  Urine culture ordered. - I reassessed the patient she is resting comfortably in bed no acute distress.  Not septic or toxic appearing.  Reexam of the abdomen shows no tenderness to suggest appendicitis, SBO, diverticulitis or other emergent intra-abdominal pathologies at this time.  Additionally patient has no unilateral flank pain or hematuria to suggest kidney stone disease.  I again spoke with patient about pelvic examination, she has again refused exam sites no abnormal vaginal discharge or bleeding.  Low  suspicion for PID at this time given no abdominal tenderness or leukocytosis, patient will follow up with her PCP and OB/GYN and return to the ED for new or worsening symptoms.  Patient was given 1 g Rocephin in the ER and will be discharged on Keflex.  I reviewed patient's most recent urine cultures which were negative last year.   At this time there does not appear to be any evidence of an acute emergency medical condition and the patient appears stable for discharge with appropriate outpatient follow up. Diagnosis was discussed with patient who verbalizes understanding of care plan and is agreeable to discharge. I have discussed return precautions with patient who verbalizes understanding. Patient encouraged to follow-up with their PCP All questions answered.  Patient's case discussed with Dr. Donnald Garre who agrees with plan to discharge with follow-up.   Note: Portions of this report may have been transcribed using voice recognition software. Every effort was made to ensure accuracy; however, inadvertent computerized transcription errors may still be present. Final Clinical Impression(s) / ED Diagnoses Final diagnoses:  Urinary tract infection without hematuria, site unspecified    Rx / DC Orders ED Discharge Orders         Ordered    cephALEXin (KEFLEX) 500 MG capsule  2 times daily     Discontinue  Reprint     09/10/19 1540           Elizabeth Palau 09/10/19 1542    Arby Barrette, MD 09/11/19 1145

## 2019-09-10 NOTE — ED Triage Notes (Signed)
Pt here with nausea, abdominal pain and back pain x 5 weeks.

## 2019-09-12 LAB — URINE CULTURE: Culture: >=100000 — AB

## 2019-09-13 ENCOUNTER — Telehealth: Payer: Self-pay | Admitting: Emergency Medicine

## 2019-09-13 NOTE — Telephone Encounter (Signed)
Post ED Visit - Positive Culture Follow-up  Culture report reviewed by antimicrobial stewardship pharmacist: [facility] []  , Pharm.D. []  Enzo Bi, Pharm.D., BCPS AQ-ID []  , Pharm.D., BCPS []  Celedonio Miyamoto, Pharm.D., BCPS []  [place], Garvin Fila.D., BCPS, AAHIVP []  , Pharm.D., BCPS, AAHIVP []  Georgina Pillion, PharmD, BCPS []  , PharmD, BCPS []  [place], PharmD, BCPS []  [address], PharmD []  , PharmD, BCPS []  Estella Husk, PharmD  Pharmacy Team []  Lysle Pearl, PharmD []  , PharmD []  Phillips Climes, PharmD []  , Rph []  Agapito Games) , PharmD []  Verlan Friends, PharmD []  , PharmD []  Mervyn Gay, PharmD []  , PharmD []  Vinnie Level, PharmD []  [facility], PharmD []  , PharmD []  Len Childs, PharmD   Positive urine culture Treated with cephalexin, organism sensitive to the same and no further patient follow-up is required at this time.  09/13/2019, 3:07 PM

## 2020-04-10 ENCOUNTER — Other Ambulatory Visit: Payer: Self-pay

## 2020-04-10 ENCOUNTER — Emergency Department (HOSPITAL_BASED_OUTPATIENT_CLINIC_OR_DEPARTMENT_OTHER)
Admission: EM | Admit: 2020-04-10 | Discharge: 2020-04-10 | Disposition: A | Discharge status: Home / Self Care | Payer: Medicaid Other | Attending: Emergency Medicine | Admitting: Emergency Medicine

## 2020-04-10 ENCOUNTER — Encounter (HOSPITAL_BASED_OUTPATIENT_CLINIC_OR_DEPARTMENT_OTHER): Payer: Self-pay | Admitting: *Deleted

## 2020-04-10 ENCOUNTER — Emergency Department (HOSPITAL_BASED_OUTPATIENT_CLINIC_OR_DEPARTMENT_OTHER): Payer: Medicaid Other

## 2020-04-10 DIAGNOSIS — F17200 Nicotine dependence, unspecified, uncomplicated: Secondary | ICD-10-CM | POA: Diagnosis not present

## 2020-04-10 DIAGNOSIS — R079 Chest pain, unspecified: Secondary | ICD-10-CM

## 2020-04-10 DIAGNOSIS — U071 COVID-19: Secondary | ICD-10-CM | POA: Diagnosis not present

## 2020-04-10 DIAGNOSIS — D72819 Decreased white blood cell count, unspecified: Secondary | ICD-10-CM | POA: Diagnosis not present

## 2020-04-10 DIAGNOSIS — R072 Precordial pain: Secondary | ICD-10-CM | POA: Diagnosis present

## 2020-04-10 LAB — D-DIMER, QUANTITATIVE: D-Dimer, Quant: 0.5 ug/mL-FEU (ref 0.00–0.50)

## 2020-04-10 LAB — TROPONIN I (HIGH SENSITIVITY)
Troponin I (High Sensitivity): <2 ng/L (ref <18)
Troponin I (High Sensitivity): <2 ng/L (ref <18)

## 2020-04-10 LAB — CBC
HCT: 39.5 % (ref 36.0–46.0)
Hemoglobin: 13.1 g/dL (ref 12.0–15.0)
MCH: 28.7 pg (ref 26.0–34.0)
MCHC: 33.2 g/dL (ref 30.0–36.0)
MCV: 86.4 fL (ref 80.0–100.0)
Platelets: 234 10*3/uL (ref 150–400)
RBC: 4.57 MIL/uL (ref 3.87–5.11)
RDW: 14 % (ref 11.5–15.5)
WBC: 3.7 10*3/uL — ABNORMAL LOW (ref 4.0–10.5)
nRBC: 0 % (ref 0.0–0.2)

## 2020-04-10 LAB — PREGNANCY, URINE: Preg Test, Ur: NEGATIVE

## 2020-04-10 LAB — BASIC METABOLIC PANEL
Anion gap: 12 (ref 5–15)
BUN: 7 mg/dL (ref 6–20)
CO2: 26 mmol/L (ref 22–32)
Calcium: 8.8 mg/dL — ABNORMAL LOW (ref 8.9–10.3)
Chloride: 100 mmol/L (ref 98–111)
Creatinine, Ser: 0.86 mg/dL (ref 0.44–1.00)
GFR, Estimated: >60 mL/min (ref >60)
Glucose, Bld: 90 mg/dL (ref 70–99)
Potassium: 3.3 mmol/L — ABNORMAL LOW (ref 3.5–5.1)
Sodium: 138 mmol/L (ref 135–145)

## 2020-04-10 MED ORDER — METHOCARBAMOL 500 MG PO TABS
500.0000 mg | ORAL_TABLET | Freq: Two times a day (BID) | ORAL | 0 refills | Status: AC
Start: 2020-04-10 — End: ?

## 2020-04-10 MED ORDER — FENTANYL CITRATE (PF) 100 MCG/2ML IJ SOLN
50.0000 ug | Freq: Once | INTRAMUSCULAR | Status: AC
  Administered 2020-04-10: 50 ug via INTRAVENOUS
  Filled 2020-04-10: qty 2

## 2020-04-10 MED ORDER — ONDANSETRON HCL 4 MG/2ML IJ SOLN
4.0000 mg | Freq: Once | INTRAMUSCULAR | Status: AC
  Administered 2020-04-10: 4 mg via INTRAVENOUS
  Filled 2020-04-10: qty 2

## 2020-04-10 MED ORDER — SODIUM CHLORIDE 0.9 % IV BOLUS
1000.0000 mL | Freq: Once | INTRAVENOUS | Status: AC
  Administered 2020-04-10: 1000 mL via INTRAVENOUS

## 2020-04-10 NOTE — ED Notes (Signed)
Pt is driving. States she will notify RN for pain medication if she is able to find an alternate way home

## 2020-04-10 NOTE — ED Provider Notes (Signed)
[facility] EMERGENCY DEPARTMENT Provider Note   CSN: [id number] Arrival date & time: 04/10/20  1401     History Chief Complaint  Patient presents with  . Chest Pain    Janet Walton is a 30 y.o. female with PMH/o STD, ectopic pregnancy who presents for evaluation of chest pain that began this morning. She states that the pain started in the substernal area and then wrapped around to the right side and now to the anterior. She states the pain is sharp in nature. She states it is worse with deep inspiration. She states that the pain is not worse with movement or exertion. The pain started this AM when she woke up and has been constant since then. She denies any assocaited fall, trauma or injury. She also reports that her body has ached and she has had myalgias. She has not noted any fevers, cough, congestion, rhinorrhea. She has not been vaccinated for COVID. No COVID exposure that she knows of. She denies any OCP use, recent immobilization, prior history of DVT/PE, recent surgery, leg swelling, or long travel.  The history is provided by the patient.    HPI: A 30 year old patient presents for evaluation of chest pain. Initial onset of pain was more than 6 hours ago. The patient's chest pain is not worse with exertion. The patient's chest pain is middle- or left-sided, is not well-localized, is not described as heaviness/pressure/tightness, is not sharp and does radiate to the arms/jaw/neck. The patient does not complain of nausea and denies diaphoresis. The patient has no history of stroke, has no history of peripheral artery disease, has not smoked in the past 90 days, denies any history of treated diabetes, has no relevant family history of coronary artery disease (first degree relative at less than age 30), is not hypertensive, has no history of hypercholesterolemia and does not have an elevated BMI (>=30).   Past Medical History:  Diagnosis Date  . STD (sexually transmitted  disease)     Patient Active Problem List   Diagnosis Date Noted  . Tobacco abuse 03/22/2017  . Ectopic pregnancy, tubal 03/22/2017    Past Surgical History:  Procedure Laterality Date  . LAPAROSCOPY Left 03/22/2017   Procedure: LAPAROSCOPY OPERATIVE WITH LEFT SALPINGECTOMY;  Surgeon: [place] BingPickens, Charlie, MD;  Location: [facility];  Service: Gynecology;  Laterality: Left;  . SALPINGOSTOMY Right    ex-lap, 2010, at [facility]. ex-lap (low transverse skin incision)  . TONSILLECTOMY       OB History    Gravida  4   Para  2   Term  2   Preterm      AB  1   Living  2     SAB  1   IAB      Ectopic      Multiple      Live Births  2           History reviewed. No pertinent family history.  Social History   Tobacco Use  . Smoking status: Current Every Day Smoker  . Smokeless tobacco: Never Used  Vaping Use  . Vaping Use: Never used  Substance Use Topics  . Alcohol use: No  . Drug use: No    Home Medications Prior to Admission medications   Medication Sig Start Date End Date Taking? Authorizing Provider  methocarbamol (ROBAXIN) 500 MG tablet Take 1 tablet (500 mg total) by mouth 2 (two) times daily. 04/10/20  Yes Maxwell CaulLayden, Jerrell Mangel A, PA-C  HYDROcodone-acetaminophen (NORCO) [phone]  MG tablet Take 1 tablet by mouth every 4 (four) hours as needed for severe pain. 12/10/18   Pricilla Loveless, MD  ibuprofen (ADVIL) 400 MG tablet Take 1 tablet (400 mg total) by mouth every 8 (eight) hours as needed. 12/10/18   Pricilla Loveless, MD  metroNIDAZOLE (FLAGYL) 500 MG tablet Take 1 tablet (500 mg total) by mouth 2 (two) times daily. One po bid x 7 days 12/05/18   Dartha Lodge, PA-C  omeprazole (PRILOSEC) 20 MG capsule Take 1 tablet daily at least 30 minutes before first dose of Carafate. 08/31/18   Molpus, John, MD  potassium chloride SA (K-DUR) 20 MEQ tablet Take 1 tablet (20 mEq total) by mouth daily. 08/31/18   Molpus, John, MD  sucralfate (CARAFATE) 1 g tablet Take 1 tablet (1 g total)  by mouth 4 (four) times daily -  with meals and at bedtime. 08/31/18   Molpus, John, MD    Allergies    Patient has no known allergies.  Review of Systems   Review of Systems  Constitutional: Negative for fever.  Respiratory: Negative for cough and shortness of breath.   Cardiovascular: Positive for chest pain.  Gastrointestinal: Negative for abdominal pain, nausea and vomiting.  Genitourinary: Negative for dysuria and hematuria.  Neurological: Negative for headaches.  All other systems reviewed and are negative.   Physical Exam Updated Vital Signs BP 102/73 (BP Location: Right Arm)   Pulse 68   Temp 98.3 F (36.8 C) (Oral)   Resp 14   Ht 5\' 6"  (1.676 m)   Wt 53.8 kg   LMP 04/04/2020   SpO2 100%   BMI 19.14 kg/m   Physical Exam Vitals and nursing note reviewed.  Constitutional:      Appearance: Normal appearance. She is well-developed and well-nourished.  HENT:     Head: Normocephalic and atraumatic.     Mouth/Throat:     Mouth: Oropharynx is clear and moist and mucous membranes are normal.  Eyes:     General: Lids are normal.     Extraocular Movements: EOM normal.     Conjunctiva/sclera: Conjunctivae normal.     Pupils: Pupils are equal, round, and reactive to light.  Cardiovascular:     Rate and Rhythm: Normal rate and regular rhythm.     Pulses: Normal pulses.     Heart sounds: Normal heart sounds. No murmur heard. No friction rub. No gallop.   Pulmonary:     Effort: Pulmonary effort is normal.     Breath sounds: Normal breath sounds.     Comments: Lungs clear to auscultation bilaterally.  Symmetric chest rise.  No wheezing, rales, rhonchi.  Abdominal:     Palpations: Abdomen is soft. Abdomen is not rigid.     Tenderness: There is no abdominal tenderness. There is no guarding.     Comments: Abdomen is soft, non-distended, non-tender. No rigidity, No guarding. No peritoneal signs.  Musculoskeletal:        General: Normal range of motion.     Cervical back:  Full passive range of motion without pain.     Comments: BLE are symmetric in appearance without any overlying warmth, erythema, or edema.   Skin:    General: Skin is warm and dry.     Capillary Refill: Capillary refill takes less than 2 seconds.  Neurological:     Mental Status: She is alert and oriented to person, place, and time.  Psychiatric:        Mood and Affect: Mood  and affect normal.        Speech: Speech normal.     ED Results / Procedures / Treatments   Labs (all labs ordered are listed, but only abnormal results are displayed) Labs Reviewed  BASIC METABOLIC PANEL - Abnormal; Notable for the following components:      Result Value   Potassium 3.3 (*)    Calcium 8.8 (*)    All other components within normal limits  CBC - Abnormal; Notable for the following components:   WBC 3.7 (*)    All other components within normal limits  SARS CORONAVIRUS 2 (TAT 6-24 HRS)  PREGNANCY, URINE  D-DIMER, QUANTITATIVE (NOT AT [facility])  TROPONIN I (HIGH SENSITIVITY)  TROPONIN I (HIGH SENSITIVITY)    EKG EKG Interpretation  Date/Time:  Saturday April 10 2020 14:15:45 EST Ventricular Rate:  86 PR Interval:  144 QRS Duration: 72 QT Interval:  372 QTC Calculation: 445 R Axis:   59 Text Interpretation: Normal sinus rhythm Right atrial enlargement Possible Anterior infarct , age undetermined Abnormal ECG no change from previous. Confirmed by Arby Barrette [phone]) on 04/10/2020 8:51:03 PM   Radiology DG Chest Portable 1 View  Result Date: 04/10/2020 CLINICAL DATA:  Chest pain. EXAM: PORTABLE CHEST 1 VIEW COMPARISON:  None. FINDINGS: The heart size and mediastinal contours are within normal limits. Both lungs are clear. No pneumothorax or pleural effusion is noted. The visualized skeletal structures are unremarkable. IMPRESSION: No active disease. Electronically Signed   By: Lupita Raider M.D.   On: 04/10/2020 19:22    Procedures Procedures (including critical care  time)  Medications Ordered in ED Medications  sodium chloride 0.9 % bolus 1,000 mL (0 mLs Intravenous Stopped 04/10/20 2041)  fentaNYL (SUBLIMAZE) injection 50 mcg (50 mcg Intravenous Given 04/10/20 1945)  ondansetron (ZOFRAN) injection 4 mg (4 mg Intravenous Given 04/10/20 1945)    ED Course  I have reviewed the triage vital signs and the nursing notes.  Pertinent labs & imaging results that were available during my care of the patient were reviewed by me and considered in my medical decision making (see chart for details).    MDM Rules/Calculators/A&P HEAR Score: 1                        30 year old female who presents for evaluation of chest pain that began today.  Reports it is worse with deep inspiration.  No cough, fevers, congestion.  She states that she has had some body aches.  She is not vaccinated for COVID.  Unknown if she has had exposure.  On initial arrival, she is afebrile nontoxic-appearing.  Vital signs are stable.  On exam, she shows no signs of respiratory distress.  Low suspicion for ACS etiology given that there are atypical features.  Suspicion for PE but is a consideration given pleuritic component of the chest pain.  Consider infectious versus MSK. History/physical exam is not concerning for dissection, pericarditis. Also consider COVID-19.  We will add COVID-19 test.  Will obtain lab work, EKG, chest x-ray.  BMP shows potassium of 3.3.  BUN/creatinine within normal limits.  CBC shows leukopenia.  Initial troponin negative.  D-dimer is negative.  Urine pregnancy negative.  Chest x-ray negative for any infectious etiology.  Delta troponin is negative.  At this time, patient has a heart score of one.  She is low risk.  She has atypical features.  She is not hypoxic or tachycardic here in the ED.  Given  her negative D-dimer and reassuring exam, do not feel this represents PE.  Question if this is COVID.  She does report she has had body aches, myalgias and is not  vaccinated.  She has a COVID test pending.  At this time, she is hemodynamically stable for discharge. At this time, patient exhibits no emergent life-threatening condition that require further evaluation in ED. Patient had ample opportunity for questions and discussion. All patient's questions were answered with full understanding. Strict return precautions discussed. Patient expresses understanding and agreement to plan.   Portions of this note were generated with Scientist, clinical (histocompatibility and immunogenetics). Dictation errors may occur despite best attempts at proofreading.   Final Clinical Impression(s) / ED Diagnoses Final diagnoses:  Nonspecific chest pain    Rx / DC Orders ED Discharge Orders         Ordered    methocarbamol (ROBAXIN) 500 MG tablet  2 times daily        04/10/20 2147           Rosana Hoes 04/10/20 2359    Arby Barrette, MD 04/11/20 1626

## 2020-04-10 NOTE — ED Triage Notes (Addendum)
Substernal non-radiating CP x 1 day. Increased pain on deep inspiration. Denies SOB

## 2020-04-10 NOTE — ED Notes (Signed)
X-ray at bedside

## 2020-04-10 NOTE — Discharge Instructions (Signed)
As we discussed, your work-up today was reassuring.  You have a COVID test pending. This may take several hours to come back. You can check online or use the MyChart App to look at your results.   You can take Tylenol or Ibuprofen as directed for pain. You can alternate Tylenol and Ibuprofen every 4 hours. If you take Tylenol at 1pm, then you can take Ibuprofen at 5pm. Then you can take Tylenol again at 9pm.   Make sure you are staying hydrated and drinking plenty of fluids.   Take Robaxin as prescribed. This medication will make you drowsy so do not drive or drink alcohol when taking it.  If you are positive, you will need to quarantine. If you are still having symptoms and you have a negative test, you need to be rechecked on day 4-5 as we discussed, sometimes the viral load is not high enough.  Return emergency department for any worsening chest pain, difficulty breathing, abdominal pain, vomiting or any other worsening concerning symptoms.

## 2020-04-11 LAB — SARS CORONAVIRUS 2 (TAT 6-24 HRS): SARS Coronavirus 2: POSITIVE — AB

## 2020-04-12 ENCOUNTER — Telehealth: Payer: Self-pay | Admitting: Infectious Diseases

## 2020-04-12 NOTE — Telephone Encounter (Signed)
Called to discuss with patient about COVID-19 symptoms and the use of one of the available treatments for those with mild to moderate Covid symptoms and at a high risk of hospitalization.  Pt appears to qualify for outpatient treatment due to co-morbid conditions and/or a member of an at-risk group in accordance with the FDA Emergency Use Authorization.    Symptom onset: 1/15 Vaccinated: no Booster?  Immunocompromised? no Qualifiers: SVI score 4, unvaccinated   Unable to reach pt - LVM and sent mychart message to her   Rexene Alberts

## 2020-05-05 ENCOUNTER — Ambulatory Visit: Payer: Medicaid Other | Attending: Physician Assistant | Admitting: Physician Assistant

## 2020-05-05 ENCOUNTER — Other Ambulatory Visit: Payer: Self-pay

## 2020-05-05 ENCOUNTER — Encounter: Payer: Self-pay | Admitting: Physician Assistant

## 2020-05-05 DIAGNOSIS — E876 Hypokalemia: Secondary | ICD-10-CM

## 2020-05-05 DIAGNOSIS — Z09 Encounter for follow-up examination after completed treatment for conditions other than malignant neoplasm: Secondary | ICD-10-CM | POA: Diagnosis not present

## 2020-05-05 DIAGNOSIS — U071 COVID-19: Secondary | ICD-10-CM

## 2020-05-05 DIAGNOSIS — F32A Depression, unspecified: Secondary | ICD-10-CM | POA: Diagnosis not present

## 2020-05-05 MED ORDER — ESCITALOPRAM OXALATE 10 MG PO TABS: 10.0000 mg | ORAL_TABLET | Freq: Every day | ORAL | 0 refills | Status: AC

## 2020-05-05 NOTE — Progress Notes (Signed)
Patient ID: Janet Walton, female   DOB: 02-08-1991, 30 y.o.   MRN: [id number]  Virtual Visit via Telephone Note  I connected with Janet Walton on 05/05/20 at  2:30 PM EST by telephone and verified that I am speaking with the correct person using two identifiers.  Location: Patient: home Provider: [facility] office   I discussed the limitations, risks, security and privacy concerns of performing an evaluation and management service by telephone and the availability of in person appointments. I also discussed with the patient that there may be a patient responsible charge related to this service. The patient expressed understanding and agreed to proceed.   History of Present Illness: After being seen in the ED 04/10/2020 and tested + for Covid.  She is feeling much better from that and is back to work.  No fever.  No SOB or cough.  No N/V/D.    She has a h/o anxiety and depression and is taking lexapro 10mg  daily.  No SI/HI.  She is not sure if it is the optimal dose but says any med she has ever taken makes her sleepy.  She only take the lexapro "prn" despite it being prescribed daily.  From ED discharge: 30 year old female who presents for evaluation of chest pain that began today.  Reports it is worse with deep inspiration.  No cough, fevers, congestion.  She states that she has had some body aches.  She is not vaccinated for COVID.  Unknown if she has had exposure.  On initial arrival, she is afebrile nontoxic-appearing.  Vital signs are stable.  On exam, she shows no signs of respiratory distress.  Low suspicion for ACS etiology given that there are atypical features.  Suspicion for PE but is a consideration given pleuritic component of the chest pain.  Consider infectious versus MSK. History/physical exam is not concerning for dissection, pericarditis. Also consider COVID-19.  We will add COVID-19 test.  Will obtain lab work, EKG, chest x-ray.  BMP shows potassium of 3.3.  BUN/creatinine  within normal limits.  CBC shows leukopenia.  Initial troponin negative.  D-dimer is negative.  Urine pregnancy negative.  Chest x-ray negative for any infectious etiology.  Delta troponin is negative.  At this time, patient has a heart score of one.  She is low risk.  She has atypical features.  She is not hypoxic or tachycardic here in the ED.  Given her negative D-dimer and reassuring exam, do not feel this represents PE.  Question if this is COVID.  She does report she has had body aches, myalgias and is not vaccinated.  She has a COVID test pending.  At this time, she is hemodynamically stable for discharge. At this time, patient exhibits no emergent life-threatening condition that require further evaluation in ED. Patient had ample opportunity for questions and discussion. All patient's questions were answered with full understanding. Strict return precautions discussed. Patient expresses understanding and agreement to plan.   Observations/Objective: NAD.  A&Ox3   Assessment and Plan: 1. Depression, unspecified depression type Encouraged compliance and taking daily.  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  No acute safety issues - escitalopram (LEXAPRO) 10 MG tablet; Take 1 tablet (10 mg total) by mouth daily.  Dispense: 90 tablet; Refill: 0 - Ambulatory referral to Integrated Behavioral Health  2. COVID resolved  3. Encounter for examination following treatment at hospital Doing well  4. Hypokalemia repleted-will recheck at next visit.  No muscle cramping or problems    Follow Up Instructions: Assign PCP in 6-8 weeks  I discussed the assessment and treatment plan with the patient. The patient was provided an opportunity to ask questions and all were answered. The patient agreed with the plan and demonstrated an understanding of the instructions.   The patient was advised to call back or seek an in-person evaluation if  the symptoms worsen or if the condition fails to improve as anticipated.  I provided 17 minutes of non-face-to-face time during this encounter.   Georgian Co, PA-C

## 2020-05-26 ENCOUNTER — Ambulatory Visit: Payer: Medicaid Other | Attending: Family Medicine | Admitting: Licensed Clinical Social Worker

## 2020-05-26 ENCOUNTER — Telehealth: Payer: Self-pay | Admitting: Family Medicine

## 2020-05-26 ENCOUNTER — Other Ambulatory Visit: Payer: Self-pay

## 2020-05-26 DIAGNOSIS — F411 Generalized anxiety disorder: Secondary | ICD-10-CM

## 2020-05-26 DIAGNOSIS — F322 Major depressive disorder, single episode, severe without psychotic features: Secondary | ICD-10-CM

## 2020-05-26 DIAGNOSIS — F439 Reaction to severe stress, unspecified: Secondary | ICD-10-CM

## 2020-05-26 NOTE — Telephone Encounter (Signed)
Janet Walton is returning a call to [name] regarding a referral.  She stated she needed some information clarified about a referral that was received.  Please call to discuss at [phone]
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
## 2020-05-27 ENCOUNTER — Telehealth: Payer: Self-pay | Admitting: Licensed Clinical Social Worker

## 2020-05-27 NOTE — Telephone Encounter (Signed)
MSW Intern called and left message for pt detailing how to call [facility] for referral process at [phone] for therapy and medication management.

## 2020-06-02 ENCOUNTER — Ambulatory Visit: Payer: Medicaid Other | Admitting: Licensed Clinical Social Worker

## 2020-06-02 ENCOUNTER — Telehealth: Payer: Self-pay | Admitting: Licensed Clinical Social Worker

## 2020-06-02 NOTE — Telephone Encounter (Signed)
MSW Intern called for appt with pt, but pt didn't answer and a message was left detailing the contact information and walk-in hours for [facility] to assist with pt establishing care.

## 2020-07-05 ENCOUNTER — Ambulatory Visit: Payer: Medicaid Other | Admitting: Family Medicine

## 2020-08-03 ENCOUNTER — Ambulatory Visit: Payer: Medicaid Other | Admitting: Family Medicine

## 2021-09-09 ENCOUNTER — Emergency Department (HOSPITAL_BASED_OUTPATIENT_CLINIC_OR_DEPARTMENT_OTHER)
Admission: EM | Admit: 2021-09-09 | Discharge: 2021-09-09 | Disposition: A | Discharge status: Home / Self Care | Payer: Medicaid Other | Attending: Emergency Medicine | Admitting: Emergency Medicine

## 2021-09-09 ENCOUNTER — Encounter (HOSPITAL_BASED_OUTPATIENT_CLINIC_OR_DEPARTMENT_OTHER): Payer: Self-pay | Admitting: Emergency Medicine

## 2021-09-09 ENCOUNTER — Other Ambulatory Visit: Payer: Self-pay

## 2021-09-09 DIAGNOSIS — K0889 Other specified disorders of teeth and supporting structures: Secondary | ICD-10-CM | POA: Diagnosis present

## 2021-09-09 DIAGNOSIS — R6884 Jaw pain: Secondary | ICD-10-CM | POA: Diagnosis not present

## 2021-09-09 DIAGNOSIS — R110 Nausea: Secondary | ICD-10-CM | POA: Diagnosis not present

## 2021-09-09 DIAGNOSIS — K029 Dental caries, unspecified: Secondary | ICD-10-CM | POA: Diagnosis not present

## 2021-09-09 MED ORDER — ONDANSETRON 4 MG PO TBDP
4.0000 mg | ORAL_TABLET | Freq: Once | ORAL | Status: AC
  Administered 2021-09-09: 4 mg via ORAL
  Filled 2021-09-09: qty 1

## 2021-09-09 MED ORDER — OXYCODONE HCL 5 MG PO TABS: 5.0000 mg | ORAL_TABLET | ORAL | 0 refills | Status: AC | PRN

## 2021-09-09 MED ORDER — NAPROXEN 500 MG PO TABS: 500.0000 mg | ORAL_TABLET | Freq: Two times a day (BID) | ORAL | 0 refills | Status: AC

## 2021-09-09 MED ORDER — AMOXICILLIN 500 MG PO CAPS: 500.0000 mg | ORAL_CAPSULE | Freq: Three times a day (TID) | ORAL | 0 refills | Status: AC

## 2021-09-09 MED ORDER — ONDANSETRON 4 MG PO TBDP: 4.0000 mg | ORAL_TABLET | Freq: Three times a day (TID) | ORAL | 0 refills | Status: AC | PRN

## 2021-09-09 NOTE — ED Triage Notes (Signed)
Pt c/o left lower dental pain "for awhile". States her lower wisdom tooth is growing in sideways, and causing her pain. Pain worsened over the past few days.

## 2021-09-09 NOTE — Discharge Instructions (Addendum)
Take the medication as prescribed.  Use caution as the oxycodone is a controlled substance and does have the addictive potential.  Do not drive or operate heavy machinery while taking this medication  Follow-up with dentistry

## 2021-09-09 NOTE — ED Provider Notes (Cosign Needed)
[facility] EMERGENCY DEPARTMENT Provider Note   CSN: [id number] Arrival date & time: 09/09/21  2204     History  Chief Complaint  Patient presents with   Dental Pain   Jaw Pain    Janet Walton is a 31 y.o. female history significant for bilateral salpingectomy due to bilateral ectopic pregnancy here for evaluation of left-sided jaw pain.  She has known chronic dental pain due to impacted molars as well as known root exposed.  Patient states pain worsening today.  Shoots up her face when she has had a cold liquids.  Also noted some mild left-sided facial swelling.  No fever, headache, chest pain, shortness of breath, intraoral lesions.  States the pain gets so bad at times it makes her nauseous.  No abdominal pain, dysuria, hematuria.  HPI     Home Medications Prior to Admission medications   Medication Sig Start Date End Date Taking? Authorizing Provider  amoxicillin (AMOXIL) 500 MG capsule Take 1 capsule (500 mg total) by mouth 3 (three) times daily. 09/09/21  Yes Mikayla Chiusano A, PA-C  naproxen (NAPROSYN) 500 MG tablet Take 1 tablet (500 mg total) by mouth 2 (two) times daily. 09/09/21  Yes Koda Defrank A, PA-C  ondansetron (ZOFRAN-ODT) 4 MG disintegrating tablet Take 1 tablet (4 mg total) by mouth every 8 (eight) hours as needed for nausea or vomiting. 09/09/21  Yes Maleigh Bagot A, PA-C  oxyCODONE (ROXICODONE) 5 MG immediate release tablet Take 1 tablet (5 mg total) by mouth every 4 (four) hours as needed for severe pain. 09/09/21  Yes Tierra Thoma A, PA-C  escitalopram (LEXAPRO) 10 MG tablet Take 1 tablet (10 mg total) by mouth daily. 05/05/20   Anders Simmonds, PA-C  methocarbamol (ROBAXIN) 500 MG tablet Take 1 tablet (500 mg total) by mouth 2 (two) times daily. 04/10/20   Maxwell Caul, PA-C      Allergies    Patient has no known allergies.    Review of Systems   Review of Systems  Constitutional: Negative.   HENT:  Positive for dental problem  and facial swelling. Negative for drooling, ear discharge, ear pain, mouth sores, nosebleeds, postnasal drip, rhinorrhea, sinus pressure, sinus pain, sneezing, sore throat, trouble swallowing and voice change.   Respiratory: Negative.    Cardiovascular: Negative.   Gastrointestinal: Negative.   Genitourinary: Negative.   Musculoskeletal: Negative.   Skin: Negative.   Neurological: Negative.   All other systems reviewed and are negative.   Physical Exam Updated Vital Signs BP 106/78 (BP Location: Left Arm)   Pulse 72   Resp 16   LMP 09/04/2021 (Approximate)   SpO2 100%  Physical Exam Vitals and nursing note reviewed.  Constitutional:      General: She is not in acute distress.    Appearance: She is well-developed. She is not ill-appearing, toxic-appearing or diaphoretic.  HENT:     Head: Atraumatic.     Jaw: There is normal jaw occlusion.     Comments: No drooling, dysphagia or trismus.  Minimal left-sided mandibular swelling.  No submandibular swelling.    Mouth/Throat:     Lips: Pink.     Mouth: Mucous membranes are moist.     Dentition: Abnormal dentition. Dental tenderness and dental caries present. No gingival swelling or dental abscesses.     Pharynx: Oropharynx is clear. Uvula midline.     Tonsils: No tonsillar exudate or tonsillar abscesses.     Comments: Poor dentition, multiple missing teeth, impacted molars with  exposed root bilaterally. Sublingual area soft. No pooling of secretions. Eyes:     Pupils: Pupils are equal, round, and reactive to light.  Neck:     Trachea: Trachea and phonation normal.     Comments: Full ROM Cardiovascular:     Rate and Rhythm: Normal rate.  Pulmonary:     Effort: No respiratory distress.  Abdominal:     General: There is no distension.  Musculoskeletal:        General: Normal range of motion.     Cervical back: Full passive range of motion without pain and normal range of motion.  Skin:    General: Skin is warm and dry.      Capillary Refill: Capillary refill takes less than 2 seconds.     Comments: No erythema, warmth  Neurological:     General: No focal deficit present.     Mental Status: She is alert.  Psychiatric:        Mood and Affect: Mood normal.    ED Results / Procedures / Treatments   Labs (all labs ordered are listed, but only abnormal results are displayed) Labs Reviewed - No data to display  EKG None  Radiology No results found.  Procedures Procedures    Medications Ordered in ED Medications  ondansetron (ZOFRAN-ODT) disintegrating tablet 4 mg (has no administration in time range)  ondansetron (ZOFRAN-ODT) disintegrating tablet 4 mg (4 mg Oral Given 09/09/21 2221)   ED Course/ Medical Decision Making/ A&P    31 year old here for evaluation of dental pain.  Known history of chronic dental pain due to impacted molars and poor dentition.  Noted some mild left-sided facial swelling earlier today.  On arrival she has no systemic symptoms.  Does have some very minimal left-sided facial swelling however does not extend to submandibular region.  I have low suspicion for Ludwi\g's angina.  Her sublingual area is soft.  She does have overall poor dentition multiple dental caries, missing teeth as well as exposed roots to her impacted molars.  She probably has early dental infection.  Will write for antibiotics, pain control she will ultimately need follow-up with dentistry for definitive management.  Do not feel she needs labs or imaging at this time.  The patient has been appropriately medically screened and/or stabilized in the ED. I have low suspicion for any other emergent medical condition which would require further screening, evaluation or treatment in the ED or require inpatient management.  Patient is hemodynamically stable and in no acute distress.  Patient able to ambulate in department prior to ED.  Evaluation does not show acute pathology that would require ongoing or additional  emergent interventions while in the emergency department or further inpatient treatment.  I have discussed the diagnosis with the patient and answered all questions.  Pain is been managed while in the emergency department and patient has no further complaints prior to discharge.  Patient is comfortable with plan discussed in room and is stable for discharge at this time.  I have discussed strict return precautions for returning to the emergency department.  Patient was encouraged to follow-up with PCP/specialist refer to at discharge.                            Medical Decision Making Amount and/or Complexity of Data Reviewed Independent Historian: friend External Data Reviewed: notes.  Risk OTC drugs. Prescription drug management. Diagnosis or treatment significantly limited by social determinants of health.  Final Clinical Impression(s) / ED Diagnoses Final diagnoses:  Pain, dental    Rx / DC Orders ED Discharge Orders          Ordered    ondansetron (ZOFRAN-ODT) 4 MG disintegrating tablet  Every 8 hours PRN        09/09/21 2331    oxyCODONE (ROXICODONE) 5 MG immediate release tablet  Every 4 hours PRN        09/09/21 2331    naproxen (NAPROSYN) 500 MG tablet  2 times daily        09/09/21 2331    amoxicillin (AMOXIL) 500 MG capsule  3 times daily        09/09/21 2331              Samuel Rittenhouse A, PA-C 09/09/21 2332

## 2022-06-08 ENCOUNTER — Other Ambulatory Visit: Payer: Self-pay

## 2022-06-08 ENCOUNTER — Emergency Department (HOSPITAL_COMMUNITY)
Admission: EM | Admit: 2022-06-08 | Discharge: 2022-06-09 | Disposition: A | Discharge status: Home / Self Care | Payer: Medicaid Other | Attending: Emergency Medicine | Admitting: Emergency Medicine

## 2022-06-08 DIAGNOSIS — E876 Hypokalemia: Secondary | ICD-10-CM | POA: Diagnosis not present

## 2022-06-08 DIAGNOSIS — T450X1A Poisoning by antiallergic and antiemetic drugs, accidental (unintentional), initial encounter: Secondary | ICD-10-CM | POA: Diagnosis present

## 2022-06-08 DIAGNOSIS — T50901A Poisoning by unspecified drugs, medicaments and biological substances, accidental (unintentional), initial encounter: Secondary | ICD-10-CM

## 2022-06-08 DIAGNOSIS — D649 Anemia, unspecified: Secondary | ICD-10-CM | POA: Diagnosis not present

## 2022-06-09 ENCOUNTER — Other Ambulatory Visit: Payer: Self-pay

## 2022-06-09 ENCOUNTER — Encounter (HOSPITAL_COMMUNITY): Payer: Self-pay

## 2022-06-09 LAB — URINALYSIS, ROUTINE W REFLEX MICROSCOPIC
Bilirubin Urine: NEGATIVE
Glucose, UA: NEGATIVE mg/dL
Hgb urine dipstick: NEGATIVE
Ketones, ur: NEGATIVE mg/dL
Leukocytes,Ua: NEGATIVE
Nitrite: NEGATIVE
Protein, ur: NEGATIVE mg/dL
Specific Gravity, Urine: 1.009 (ref 1.005–1.030)
pH: 6 (ref 5.0–8.0)

## 2022-06-09 LAB — COMPREHENSIVE METABOLIC PANEL
ALT: 21 U/L (ref 0–44)
AST: 21 U/L (ref 15–41)
Albumin: 4 g/dL (ref 3.5–5.0)
Alkaline Phosphatase: 43 U/L (ref 38–126)
Anion gap: 12 (ref 5–15)
BUN: 10 mg/dL (ref 6–20)
CO2: 22 mmol/L (ref 22–32)
Calcium: 9 mg/dL (ref 8.9–10.3)
Chloride: 104 mmol/L (ref 98–111)
Creatinine, Ser: 0.85 mg/dL (ref 0.44–1.00)
GFR, Estimated: >60 mL/min (ref >60)
Glucose, Bld: 91 mg/dL (ref 70–99)
Potassium: 2.9 mmol/L — ABNORMAL LOW (ref 3.5–5.1)
Sodium: 138 mmol/L (ref 135–145)
Total Bilirubin: 0.7 mg/dL (ref 0.3–1.2)
Total Protein: 7.3 g/dL (ref 6.5–8.1)

## 2022-06-09 LAB — MAGNESIUM: Magnesium: 1.8 mg/dL (ref 1.7–2.4)

## 2022-06-09 LAB — CBC
HCT: 35.6 % — ABNORMAL LOW (ref 36.0–46.0)
Hemoglobin: 11.5 g/dL — ABNORMAL LOW (ref 12.0–15.0)
MCH: 28.1 pg (ref 26.0–34.0)
MCHC: 32.3 g/dL (ref 30.0–36.0)
MCV: 87 fL (ref 80.0–100.0)
Platelets: 275 10*3/uL (ref 150–400)
RBC: 4.09 MIL/uL (ref 3.87–5.11)
RDW: 14.1 % (ref 11.5–15.5)
WBC: 7.5 10*3/uL (ref 4.0–10.5)
nRBC: 0 % (ref 0.0–0.2)

## 2022-06-09 LAB — CBG MONITORING, ED
Glucose-Capillary: 61 mg/dL — ABNORMAL LOW (ref 70–99)
Glucose-Capillary: 147 mg/dL — ABNORMAL HIGH (ref 70–99)

## 2022-06-09 LAB — SALICYLATE LEVEL: Salicylate Lvl: <7 mg/dL — ABNORMAL LOW (ref 7.0–30.0)

## 2022-06-09 LAB — I-STAT BETA HCG BLOOD, ED (MC, WL, AP ONLY): I-stat hCG, quantitative: <5 m[IU]/mL (ref <5)

## 2022-06-09 LAB — ACETAMINOPHEN LEVEL: Acetaminophen (Tylenol), Serum: <10 ug/mL — ABNORMAL LOW (ref 10–30)

## 2022-06-09 MED ORDER — POTASSIUM CHLORIDE CRYS ER 20 MEQ PO TBCR
40.0000 meq | EXTENDED_RELEASE_TABLET | Freq: Once | ORAL | Status: AC
  Administered 2022-06-09: 40 meq via ORAL
  Filled 2022-06-09: qty 2

## 2022-06-09 MED ORDER — POTASSIUM CHLORIDE ER 10 MEQ PO TBCR: 10.0000 meq | EXTENDED_RELEASE_TABLET | Freq: Every day | ORAL | 0 refills | Status: AC

## 2022-06-09 MED ORDER — POTASSIUM CHLORIDE 10 MEQ/100ML IV SOLN
10.0000 meq | INTRAVENOUS | Status: AC
  Administered 2022-06-09 (×2): 10 meq via INTRAVENOUS
  Filled 2022-06-09 (×2): qty 100

## 2022-06-09 MED ORDER — SODIUM CHLORIDE 0.9 % IV BOLUS
1000.0000 mL | Freq: Once | INTRAVENOUS | Status: AC
  Administered 2022-06-09: 1000 mL via INTRAVENOUS

## 2022-06-09 MED ORDER — DEXTROSE 50 % IV SOLN
1.0000 | Freq: Once | INTRAVENOUS | Status: AC
  Administered 2022-06-09: 50 mL via INTRAVENOUS
  Filled 2022-06-09: qty 50

## 2022-06-09 NOTE — ED Triage Notes (Signed)
Pt arrived to triage concerned about an overdose. Pt states that about 1hr ago she took a handful of her hydroxyzine.   She is not feeling dizzy with nausea and vomiting   Pt states that she was taking the meds for panic attack, denies SI

## 2022-06-09 NOTE — Discharge Instructions (Addendum)
Please do not take more medication than that is prescribed as it can cause serious harm.  Please follow the directions of all of your medications.  Your potassium is low given short course of potassium please follow-up with your primary doctor weeks time for repeat of your potassium level.  Come back to the emergency department if you develop chest pain, shortness of breath, severe abdominal pain, uncontrolled nausea, vomiting, diarrhea.

## 2022-06-09 NOTE — ED Provider Notes (Signed)
[facility] Provider Note   CSN: [id number] Arrival date & time: 06/08/22  2351     History  Chief Complaint  Patient presents with   Ingestion    Janet Walton is a 32 y.o. female.  HPI   Patient with medical history including anxiety presenting with accidental overdose, states that she was having a panic attack at around 9:30 PM yesterday 03/14 she had taken a handful of her Vistaril, 25 mg, took approximately 10-20 of them, this was not an attempt to harm herself this was just to stop her anxiety attack.  She states that she not take any other medications, patient states she feels tired, dizzy, nauseous, she has no other complaints.  Home Medications Prior to Admission medications   Medication Sig Start Date End Date Taking? Authorizing Provider  potassium chloride (KLOR-CON) 10 MEQ tablet Take 1 tablet (10 mEq total) by mouth daily for 5 days. 06/09/22 06/14/22 Yes Marcello Fennel, PA-C  amoxicillin (AMOXIL) 500 MG capsule Take 1 capsule (500 mg total) by mouth 3 (three) times daily. 09/09/21   Henderly, Britni A, PA-C  escitalopram (LEXAPRO) 10 MG tablet Take 1 tablet (10 mg total) by mouth daily. 05/05/20   Argentina Donovan, PA-C  methocarbamol (ROBAXIN) 500 MG tablet Take 1 tablet (500 mg total) by mouth 2 (two) times daily. 04/10/20   Volanda Napoleon, PA-C  naproxen (NAPROSYN) 500 MG tablet Take 1 tablet (500 mg total) by mouth 2 (two) times daily. 09/09/21   Henderly, Britni A, PA-C  ondansetron (ZOFRAN-ODT) 4 MG disintegrating tablet Take 1 tablet (4 mg total) by mouth every 8 (eight) hours as needed for nausea or vomiting. 09/09/21   Henderly, Britni A, PA-C  oxyCODONE (ROXICODONE) 5 MG immediate release tablet Take 1 tablet (5 mg total) by mouth every 4 (four) hours as needed for severe pain. 09/09/21   Henderly, Britni A, PA-C      Allergies    Patient has no known allergies.    Review of Systems   Review of Systems   Constitutional:  Negative for chills and fever.  Respiratory:  Negative for shortness of breath.   Cardiovascular:  Negative for chest pain.  Gastrointestinal:  Positive for nausea. Negative for abdominal pain.  Neurological:  Positive for dizziness. Negative for headaches.    Physical Exam Updated Vital Signs BP 97/69   Pulse 76   Temp 98 F (36.7 C)   Resp 15   Ht 5\' 6"  (1.676 m)   Wt 52.2 kg   LMP  (LMP Unknown)   SpO2 98%   BMI 18.56 kg/m  Physical Exam Vitals and nursing note reviewed.  Constitutional:      General: She is not in acute distress.    Appearance: She is not ill-appearing.  HENT:     Head: Normocephalic and atraumatic.     Nose: No congestion.  Eyes:     Conjunctiva/sclera: Conjunctivae normal.  Cardiovascular:     Rate and Rhythm: Normal rate and regular rhythm.     Pulses: Normal pulses.     Heart sounds: No murmur heard.    No friction rub. No gallop.  Pulmonary:     Effort: No respiratory distress.     Breath sounds: No wheezing, rhonchi or rales.  Skin:    General: Skin is warm and dry.  Neurological:     Mental Status: She is alert.  Psychiatric:        Mood  and Affect: Mood normal.     ED Results / Procedures / Treatments   Labs (all labs ordered are listed, but only abnormal results are displayed) Labs Reviewed  COMPREHENSIVE METABOLIC PANEL - Abnormal; Notable for the following components:      Result Value   Potassium 2.9 (*)    All other components within normal limits  CBC - Abnormal; Notable for the following components:   Hemoglobin 11.5 (*)    HCT 35.6 (*)    All other components within normal limits  ACETAMINOPHEN LEVEL - Abnormal; Notable for the following components:   Acetaminophen (Tylenol), Serum <10 (*)    All other components within normal limits  SALICYLATE LEVEL - Abnormal; Notable for the following components:   Salicylate Lvl [id number] (*)    All other components within normal limits  URINALYSIS, ROUTINE W  REFLEX MICROSCOPIC - Abnormal; Notable for the following components:   APPearance HAZY (*)    All other components within normal limits  CBG MONITORING, ED - Abnormal; Notable for the following components:   Glucose-Capillary 61 (*)    All other components within normal limits  CBG MONITORING, ED - Abnormal; Notable for the following components:   Glucose-Capillary 147 (*)    All other components within normal limits  MAGNESIUM  I-STAT BETA HCG BLOOD, ED ([facility], [facility], [facility] ONLY)    EKG None  Radiology No results found.  Procedures Procedures    Medications Ordered in ED Medications  sodium chloride 0.9 % bolus 1,000 mL (0 mLs Intravenous Stopped 06/09/22 0243)  dextrose 50 % solution 50 mL (50 mLs Intravenous Given 06/09/22 0155)  potassium chloride SA (KLOR-CON M) CR tablet 40 mEq (40 mEq Oral Given 06/09/22 0301)  potassium chloride 10 mEq in 100 mL IVPB (0 mEq Intravenous Stopped 06/09/22 0455)    ED Course/ Medical Decision Making/ A&P                             Medical Decision Making Amount and/or Complexity of Data Reviewed Labs: ordered.  Risk Prescription drug management.   This patient presents to the ED for concern of overdose, this involves an extensive number of treatment options, and is a complaint that carries with it a high risk of complications and morbidity.  The differential diagnosis includes metabolic derailment, psychiatric emergency, withdraws    Additional history obtained:  Additional history obtained from N/A External records from outside source obtained and reviewed including PCP notes   Co morbidities that complicate the patient evaluation  Anxiety  Social Determinants of Health:  N/A    Lab Tests:  I Ordered, and personally interpreted labs.  The pertinent results include: CBC shows normocytic anemia hemoglobin 9.5, CMP shows potassium 2.9, acetaminophen less than 10, salicylate less than 10, CBG 60, i-STAT hCG less than 5, UA is  unremarkable   Imaging Studies ordered:  I ordered imaging studies including N/A I independently visualized and interpreted imaging which showed N/A I agree with the radiologist interpretation   Cardiac Monitoring:  The patient was maintained on a cardiac monitor.  I personally viewed and interpreted the cardiac monitored which showed an underlying rhythm of: Without signs of ischemia   Medicines ordered and prescription drug management:  I ordered medication including fluids, I have reviewed the patients home medicines and have made adjustments as needed  Critical Interventions:  N/A   Reevaluation:  Presents with accidental overdose, will obtain basic lab workup  speak with poison control  Patient's initial CBG is 61, will provide her with an amp of D50, and provided with food.  Repeat CBG 147, potassium is low at 2.9, will add on magnesium and provide supplemental potassium  Repeat EKG was improved from prior, no longer has borderline QT prolongation, patient is tolerating p.o., ambulating, she is agreement discharge at this time.    Consultations Obtained:  Spoke with poison control, they advised that patient will need to be observed for minimum of 6 hours, they recommend keeping patient on cardiac monitoring for that duration, rechecking EKG at the end of those 6 hours, things to look out for arm prolonged QT, increasing time, agitation, seizures, recommends Ativan as well as fluids, would not provide her any medication that would prolong her QT.    Test Considered:  N/A    Rule out Suspicion for psychiatric emergency is low at this time not endorsing any suicidal or homicidal ideations, does not appear to be respond to internal stimuli.  She did overdose on Vistaril but she states this was accidental as she just had significant anxiety.  Metabolic derailment lab work is unremarkable, corrected potassium with IV and oral potassium.  I doubt withdrawals that she  is on traumas on exam presentation and age of etiology.   Dispostion and problem list  After consideration of the diagnostic results and the patients response to treatment, I feel that the patent would benefit from discharge.  Accidental overdose-explained to patient the dangers of taking too much of her medications, states that she will not do this again, will have her follow with her primary care provider. Hypokalemia-will start on short course of supplemental potassium, follow-up with her primary doctor for further assessment.            Final Clinical Impression(s) / ED Diagnoses Final diagnoses:  Accidental overdose, initial encounter  Hypokalemia    Rx / DC Orders ED Discharge Orders          Ordered    potassium chloride (KLOR-CON) 10 MEQ tablet  Daily        06/09/22 0543              Marcello Fennel, PA-C 06/09/22 [id number]    Margette Fast, MD 06/10/22 [phone]

## 2023-10-15 ENCOUNTER — Other Ambulatory Visit: Payer: Self-pay | Admitting: Medical Genetics

## 2024-01-04 ENCOUNTER — Encounter: Payer: Self-pay | Admitting: *Deleted

## 2024-01-04 ENCOUNTER — Other Ambulatory Visit: Payer: Self-pay | Admitting: Medical Genetics

## 2024-01-04 DIAGNOSIS — Z006 Encounter for examination for normal comparison and control in clinical research program: Secondary | ICD-10-CM
